# Patient Record
Sex: Male | Born: 1970 | Race: Black or African American | Hispanic: No | Marital: Married | Smoking: Former smoker
Health system: Southern US, Community
[De-identification: ages and names within clinical notes are randomized; demographics above are authoritative.]

## PROBLEM LIST (undated history)

## (undated) DIAGNOSIS — E785 Hyperlipidemia, unspecified: Secondary | ICD-10-CM

## (undated) DIAGNOSIS — E119 Type 2 diabetes mellitus without complications: Secondary | ICD-10-CM

## (undated) DIAGNOSIS — I10 Essential (primary) hypertension: Secondary | ICD-10-CM

## (undated) DIAGNOSIS — K921 Melena: Secondary | ICD-10-CM

## (undated) HISTORY — DX: Essential (primary) hypertension: I10

## (undated) HISTORY — DX: Hyperlipidemia, unspecified: E78.5

## (undated) HISTORY — PX: HERNIA REPAIR: SHX51

## (undated) HISTORY — DX: Melena: K92.1

---

## 1997-10-31 ENCOUNTER — Encounter: Admission: RE | Admit: 1997-10-31 | Discharge: 1997-10-31 | Payer: Self-pay | Admitting: *Deleted

## 1997-10-31 ENCOUNTER — Emergency Department (HOSPITAL_COMMUNITY): Admission: EM | Admit: 1997-10-31 | Discharge: 1997-10-31 | Payer: Self-pay | Admitting: Emergency Medicine

## 1997-11-03 ENCOUNTER — Encounter: Admission: RE | Admit: 1997-11-03 | Discharge: 1998-02-01 | Payer: Self-pay | Admitting: Internal Medicine

## 2002-08-17 ENCOUNTER — Emergency Department (HOSPITAL_COMMUNITY): Admission: EM | Admit: 2002-08-17 | Discharge: 2002-08-18 | Payer: Self-pay

## 2002-08-18 ENCOUNTER — Encounter: Payer: Self-pay | Admitting: Emergency Medicine

## 2004-01-01 ENCOUNTER — Inpatient Hospital Stay (HOSPITAL_COMMUNITY): Admission: EM | Admit: 2004-01-01 | Discharge: 2004-01-04 | Payer: Self-pay | Admitting: Emergency Medicine

## 2006-12-26 ENCOUNTER — Emergency Department (HOSPITAL_COMMUNITY): Admission: EM | Admit: 2006-12-26 | Discharge: 2006-12-26 | Payer: Self-pay | Admitting: Emergency Medicine

## 2007-02-11 ENCOUNTER — Emergency Department (HOSPITAL_COMMUNITY): Admission: EM | Admit: 2007-02-11 | Discharge: 2007-02-11 | Payer: Self-pay | Admitting: Emergency Medicine

## 2010-09-23 ENCOUNTER — Ambulatory Visit (HOSPITAL_COMMUNITY)
Admission: RE | Admit: 2010-09-23 | Discharge: 2010-09-23 | Disposition: A | Discharge status: Home / Self Care | Payer: 59 | Source: Ambulatory Visit | Attending: Gastroenterology | Admitting: Gastroenterology

## 2010-09-23 DIAGNOSIS — K921 Melena: Secondary | ICD-10-CM | POA: Insufficient documentation

## 2010-09-23 DIAGNOSIS — E119 Type 2 diabetes mellitus without complications: Secondary | ICD-10-CM | POA: Insufficient documentation

## 2010-09-23 DIAGNOSIS — I10 Essential (primary) hypertension: Secondary | ICD-10-CM | POA: Insufficient documentation

## 2010-09-23 HISTORY — PX: COLONOSCOPY: SHX174

## 2010-10-10 NOTE — Op Note (Signed)
  Kirk Cox, Kirk Cox               ACCOUNT NO.:  [id number]  MEDICAL RECORD NO.:  [id number]           PATIENT TYPE:  O  LOCATION:  WLEN                         FACILITY:  [facility]  PHYSICIAN:  Danise Edge, M.D.   DATE OF BIRTH:  1971/02/12  DATE OF PROCEDURE: DATE OF DISCHARGE:                              OPERATIVE REPORT   PROCEDURE:  Diagnostic colonoscopy.  REFERRING PHYSICIAN:  Ancil Boozer, MD.  HISTORY:  Mr. Adon Gehlhausen is a 40 year old male born on Apr 25, 1971.  The patient is undergoing a diagnostic colonoscopy to evaluate resolved hematochezia.  ENDOSCOPIST:  Danise Edge, M.D.  PREMEDICATION:  Fentanyl 100 mcg, Versed 10 mg.  PROCEDURE:  After obtaining informed consent, the patient was placed in the left lateral decubitus position.  Anal inspection and digital rectal exam were normal.  The Pentax pediatric colonoscope was introduced into the rectum and easily advanced to the cecum.  A normal-appearing ileocecal valve and appendiceal orifice were identified.  Colonic preparation for the exam today was good.  Rectum normal.  Retroflex view of the distal rectum normal.  Sigmoid colon and descending colon normal.  Splenic flexure normal.  Transverse colon normal.  Hepatic flexure normal.  Ascending colon normal.  Cecum and ileocecal valve normal.  ASSESSMENT:  Normal diagnostic proctocolonoscopy to the cecum.  RECOMMENDATIONS:  Schedule screening colonoscopy in 10 years.          ______________________________ Danise Edge, M.D.     MJ/MEDQ  D:  09/23/2010  T:  09/23/2010  Job:  [id number]  cc:   Ancil Boozer, MD Fax: [phone]  Electronically Signed by Danise Edge M.D. on 10/10/2010 08:52:39 AM

## 2010-10-15 NOTE — Discharge Summary (Signed)
NAMEPIERSON, Kirk Cox                         ACCOUNT NO.:  [id number]   MEDICAL RECORD NO.:  [id number]                   PATIENT TYPE:  INP   LOCATION:  0471                                 FACILITY:  [facility]   PHYSICIAN:  Jonna L. Robb Matar, M.D.            DATE OF BIRTH:  10/29/1970   DATE OF ADMISSION:  01/01/2004  DATE OF DISCHARGE:  01/04/2004                                 DISCHARGE SUMMARY   PRIMARY CARE PHYSICIAN:  Unassigned.   FINAL DIAGNOSES:  1. Polymyositis of uncertain etiology.  2. Abnormal liver function tests.  3. Hypokalemia.   HISTORY:  This 40 year old African-American male had a history characterized  by anorexia, low-grade fever, headache, and he started to develop a  generalized numbness sensation of his body and soreness, and subjective  swelling in his forearms and thighs, and it has become increasingly painful,  and the patient came to the emergency room.  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   PHYSICAL EXAMINATION:  VITAL SIGNS:  Temperature of 101.3.  NECK:  Supple and nontender.  ABDOMEN:  There was no hepatosplenomegaly.  EXTREMITIES:  He had swelling, edema, and tenderness to palpation of the  forearms and thighs.  The patient was quite uncomfortable.   LABORATORY DATA:  Initial laboratory data showed a sodium of 132, potassium  3.4, BUN 12, creatinine 1.4.  AST and ALT 51 and 48.  White count 3.3.   HOSPITAL COURSE:  The patient was hydrated.  The fever cleared over the next  24 hours, although he remained very sore and stiff and slightly weak.  By  January 03, 2004, the weakness had improved.  Workup showed that the SGOT  initially went from 51 down to 40, but had increased back up to 115,  although that was subsequent to taking doxycycline.  CK was 507 and then  609.  Workup included negative hepatitis A, B, and C, RA, ANA, sedimentation  rate was only 5, TSH was normal.  HIV was negative.  BNP  negative.  Dr.  Roxan Hockey was consulted for infectious disease and felt the patient may be  more likely to have an immune reaction, polymyositis, although this pain  could also be triggered by viruses.  The patient also has a history of tick  exposure, so he was started empirically on some doxycycline.   I discussed the patient with Dr. Orlin Cox over the phone.  She stated that  tests such as EMG's and nerve conduction velocity, and muscle biopsy  generally do not turn positive for at least two weeks.   DISPOSITION:  The patient will be discharged on doxycycline 100 mg b.i.d.  for six more days.  He is to be seen by Dr. Orlin Cox in two weeks, and at  that time he can have followup CK, aldolase, liver function tests, and anti-  Jo-1 antibody test is also  pending.                                               Jonna L. Robb Matar, M.D.    Dorna Bloom  D:  01/04/2004  T:  01/04/2004  Job:  [id number]   cc:   Kirk Cox, M.D.  [address]  [address]  [place]  [place] [address]  Fax: [phone]
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
## 2012-08-21 ENCOUNTER — Emergency Department (HOSPITAL_COMMUNITY): Payer: 59

## 2012-08-21 ENCOUNTER — Encounter (HOSPITAL_COMMUNITY): Payer: Self-pay | Admitting: *Deleted

## 2012-08-21 ENCOUNTER — Inpatient Hospital Stay (HOSPITAL_COMMUNITY): Payer: 59 | Admitting: Anesthesiology

## 2012-08-21 ENCOUNTER — Observation Stay (HOSPITAL_COMMUNITY)
Admission: EM | Admit: 2012-08-21 | Discharge: 2012-08-22 | Disposition: A | Discharge status: Home / Self Care | Payer: 59 | Attending: Surgery | Admitting: Surgery

## 2012-08-21 ENCOUNTER — Inpatient Hospital Stay (HOSPITAL_COMMUNITY): Payer: 59

## 2012-08-21 ENCOUNTER — Encounter (HOSPITAL_COMMUNITY): Payer: Self-pay | Admitting: Anesthesiology

## 2012-08-21 ENCOUNTER — Encounter (HOSPITAL_COMMUNITY)
Admission: EM | Disposition: A | Discharge status: Home / Self Care | Payer: Self-pay | Source: Home / Self Care | Attending: Surgery

## 2012-08-21 DIAGNOSIS — R109 Unspecified abdominal pain: Secondary | ICD-10-CM | POA: Insufficient documentation

## 2012-08-21 DIAGNOSIS — K8010 Calculus of gallbladder with chronic cholecystitis without obstruction: Secondary | ICD-10-CM

## 2012-08-21 DIAGNOSIS — K8000 Calculus of gallbladder with acute cholecystitis without obstruction: Secondary | ICD-10-CM | POA: Insufficient documentation

## 2012-08-21 DIAGNOSIS — Z79899 Other long term (current) drug therapy: Secondary | ICD-10-CM | POA: Insufficient documentation

## 2012-08-21 DIAGNOSIS — F17200 Nicotine dependence, unspecified, uncomplicated: Secondary | ICD-10-CM | POA: Insufficient documentation

## 2012-08-21 DIAGNOSIS — E119 Type 2 diabetes mellitus without complications: Secondary | ICD-10-CM | POA: Insufficient documentation

## 2012-08-21 DIAGNOSIS — K429 Umbilical hernia without obstruction or gangrene: Secondary | ICD-10-CM | POA: Insufficient documentation

## 2012-08-21 DIAGNOSIS — K810 Acute cholecystitis: Secondary | ICD-10-CM

## 2012-08-21 DIAGNOSIS — I10 Essential (primary) hypertension: Secondary | ICD-10-CM | POA: Insufficient documentation

## 2012-08-21 HISTORY — PX: UMBILICAL HERNIA REPAIR: SHX196

## 2012-08-21 HISTORY — PX: CHOLECYSTECTOMY: SHX55

## 2012-08-21 HISTORY — DX: Type 2 diabetes mellitus without complications: E11.9

## 2012-08-21 LAB — POCT I-STAT, CHEM 8
Calcium, Ion: 1.15 mmol/L (ref 1.12–1.23)
Creatinine, Ser: 1.1 mg/dL (ref 0.50–1.35)
Glucose, Bld: 143 mg/dL — ABNORMAL HIGH (ref 70–99)
Hemoglobin: 15 g/dL (ref 13.0–17.0)
TCO2: 28 mmol/L (ref 0–100)

## 2012-08-21 LAB — HEMOGLOBIN A1C: Hgb A1c MFr Bld: 5.8 % — ABNORMAL HIGH (ref <5.7)

## 2012-08-21 LAB — COMPREHENSIVE METABOLIC PANEL
ALT: 22 U/L (ref 0–53)
AST: 22 U/L (ref 0–37)
Albumin: 4.2 g/dL (ref 3.5–5.2)
Alkaline Phosphatase: 76 U/L (ref 39–117)
BUN: 17 mg/dL (ref 6–23)
Chloride: 99 mEq/L (ref 96–112)
Potassium: 3.6 mEq/L (ref 3.5–5.1)
Total Bilirubin: 0.4 mg/dL (ref 0.3–1.2)

## 2012-08-21 LAB — GLUCOSE, CAPILLARY
Glucose-Capillary: 135 mg/dL — ABNORMAL HIGH (ref 70–99)
Glucose-Capillary: 100 mg/dL — ABNORMAL HIGH (ref 70–99)
Glucose-Capillary: 95 mg/dL (ref 70–99)

## 2012-08-21 LAB — CBC WITH DIFFERENTIAL/PLATELET
Basophils Absolute: 0 10*3/uL (ref 0.0–0.1)
HCT: 40.7 % (ref 39.0–52.0)
Hemoglobin: 14.1 g/dL (ref 13.0–17.0)
Lymphocytes Relative: 42 % (ref 12–46)
Monocytes Absolute: 0.5 10*3/uL (ref 0.1–1.0)
Neutro Abs: 3.1 10*3/uL (ref 1.7–7.7)
RDW: 12.6 % (ref 11.5–15.5)
WBC: 6.6 10*3/uL (ref 4.0–10.5)

## 2012-08-21 LAB — LIPASE, BLOOD: Lipase: 31 U/L (ref 11–59)

## 2012-08-21 SURGERY — LAPAROSCOPIC CHOLECYSTECTOMY WITH INTRAOPERATIVE CHOLANGIOGRAM
Anesthesia: General | Site: Abdomen | Wound class: Clean Contaminated

## 2012-08-21 MED ORDER — EPHEDRINE SULFATE 50 MG/ML IJ SOLN
INTRAMUSCULAR | Status: DC | PRN
  Administered 2012-08-21: 10 mg via INTRAVENOUS

## 2012-08-21 MED ORDER — HYDROMORPHONE HCL PF 1 MG/ML IJ SOLN
1.0000 mg | Freq: Once | INTRAMUSCULAR | Status: AC
  Administered 2012-08-21: 1 mg via INTRAVENOUS
  Filled 2012-08-21: qty 1

## 2012-08-21 MED ORDER — NEOSTIGMINE METHYLSULFATE 1 MG/ML IJ SOLN
INTRAMUSCULAR | Status: DC | PRN
  Administered 2012-08-21: 4 mg via INTRAVENOUS

## 2012-08-21 MED ORDER — GLYCOPYRROLATE 0.2 MG/ML IJ SOLN
INTRAMUSCULAR | Status: DC | PRN
  Administered 2012-08-21: .6 mg via INTRAVENOUS

## 2012-08-21 MED ORDER — SODIUM CHLORIDE 0.9 % IV SOLN
INTRAVENOUS | Status: DC | PRN
  Administered 2012-08-21: 10:00:00

## 2012-08-21 MED ORDER — ACETAMINOPHEN 10 MG/ML IV SOLN
INTRAVENOUS | Status: AC
  Filled 2012-08-21: qty 100

## 2012-08-21 MED ORDER — INSULIN ASPART 100 UNIT/ML ~~LOC~~ SOLN: 0.0000 [IU] | Freq: Three times a day (TID) | SUBCUTANEOUS | Status: DC

## 2012-08-21 MED ORDER — SODIUM CHLORIDE 0.9 % IV SOLN
INTRAVENOUS | Status: DC
  Administered 2012-08-21: 19:00:00 via INTRAVENOUS

## 2012-08-21 MED ORDER — IBUPROFEN 600 MG PO TABS
600.0000 mg | ORAL_TABLET | Freq: Four times a day (QID) | ORAL | Status: DC | PRN
  Filled 2012-08-21: qty 1

## 2012-08-21 MED ORDER — SUCCINYLCHOLINE CHLORIDE 20 MG/ML IJ SOLN
INTRAMUSCULAR | Status: DC | PRN
  Administered 2012-08-21: 140 mg via INTRAVENOUS

## 2012-08-21 MED ORDER — ONDANSETRON HCL 4 MG/2ML IJ SOLN: 4.0000 mg | Freq: Four times a day (QID) | INTRAMUSCULAR | Status: DC | PRN

## 2012-08-21 MED ORDER — LACTATED RINGERS IV SOLN
INTRAVENOUS | Status: DC
  Administered 2012-08-21: 1000 mL via INTRAVENOUS

## 2012-08-21 MED ORDER — METOCLOPRAMIDE HCL 5 MG/ML IJ SOLN
INTRAMUSCULAR | Status: DC | PRN
  Administered 2012-08-21: 2.5 mg via INTRAVENOUS

## 2012-08-21 MED ORDER — HYDROMORPHONE HCL PF 1 MG/ML IJ SOLN: 1.0000 mg | Freq: Once | INTRAMUSCULAR | Status: AC

## 2012-08-21 MED ORDER — OXYCODONE-ACETAMINOPHEN 5-325 MG PO TABS
1.0000 | ORAL_TABLET | ORAL | Status: DC | PRN
  Administered 2012-08-22 (×2): 2 via ORAL
  Filled 2012-08-21 (×2): qty 2

## 2012-08-21 MED ORDER — FAMOTIDINE 20 MG PO TABS
20.0000 mg | ORAL_TABLET | Freq: Once | ORAL | Status: AC
  Administered 2012-08-21: 20 mg via ORAL
  Filled 2012-08-21: qty 1

## 2012-08-21 MED ORDER — HYDROMORPHONE HCL PF 1 MG/ML IJ SOLN
INTRAMUSCULAR | Status: AC
  Filled 2012-08-21: qty 1

## 2012-08-21 MED ORDER — MORPHINE SULFATE 2 MG/ML IJ SOLN
1.0000 mg | INTRAMUSCULAR | Status: DC | PRN
  Administered 2012-08-21: 4 mg via INTRAVENOUS
  Administered 2012-08-21 (×2): 2 mg via INTRAVENOUS
  Filled 2012-08-21: qty 2
  Filled 2012-08-21 (×2): qty 1

## 2012-08-21 MED ORDER — GLIPIZIDE 5 MG PO TABS
5.0000 mg | ORAL_TABLET | Freq: Two times a day (BID) | ORAL | Status: DC
  Administered 2012-08-21 – 2012-08-22 (×2): 5 mg via ORAL
  Filled 2012-08-21 (×4): qty 1

## 2012-08-21 MED ORDER — FENTANYL CITRATE 0.05 MG/ML IJ SOLN
50.0000 ug | Freq: Once | INTRAMUSCULAR | Status: AC
  Administered 2012-08-21: 50 ug via INTRAVENOUS
  Filled 2012-08-21: qty 2

## 2012-08-21 MED ORDER — SODIUM CHLORIDE 0.9 % IV BOLUS (SEPSIS)
1000.0000 mL | Freq: Once | INTRAVENOUS | Status: AC
  Administered 2012-08-21: 1000 mL via INTRAVENOUS

## 2012-08-21 MED ORDER — BUPIVACAINE-EPINEPHRINE 0.25% -1:200000 IJ SOLN
INTRAMUSCULAR | Status: DC | PRN
  Administered 2012-08-21: 20 mL

## 2012-08-21 MED ORDER — HYDROMORPHONE HCL PF 1 MG/ML IJ SOLN
INTRAMUSCULAR | Status: AC
  Administered 2012-08-21: 1 mg via INTRAVENOUS
  Filled 2012-08-21: qty 1

## 2012-08-21 MED ORDER — CEFAZOLIN SODIUM-DEXTROSE 2-3 GM-% IV SOLR
2.0000 g | Freq: Three times a day (TID) | INTRAVENOUS | Status: DC
  Administered 2012-08-21: 2 g via INTRAVENOUS
  Filled 2012-08-21 (×2): qty 50

## 2012-08-21 MED ORDER — PROPOFOL 10 MG/ML IV EMUL
INTRAVENOUS | Status: DC | PRN
  Administered 2012-08-21: 200 mg via INTRAVENOUS

## 2012-08-21 MED ORDER — LIDOCAINE HCL (CARDIAC) 20 MG/ML IV SOLN
INTRAVENOUS | Status: DC | PRN
  Administered 2012-08-21: 75 mg via INTRAVENOUS

## 2012-08-21 MED ORDER — PHENYLEPHRINE HCL 10 MG/ML IJ SOLN
INTRAMUSCULAR | Status: DC | PRN
  Administered 2012-08-21: 20 ug via INTRAVENOUS

## 2012-08-21 MED ORDER — LACTATED RINGERS IV SOLN
INTRAVENOUS | Status: DC | PRN
  Administered 2012-08-21: 1000 mL

## 2012-08-21 MED ORDER — POTASSIUM CHLORIDE IN NACL 20-0.45 MEQ/L-% IV SOLN
INTRAVENOUS | Status: DC
  Filled 2012-08-21 (×2): qty 1000

## 2012-08-21 MED ORDER — HYDROMORPHONE HCL PF 1 MG/ML IJ SOLN
0.2500 mg | INTRAMUSCULAR | Status: DC | PRN
  Administered 2012-08-21 (×4): 0.5 mg via INTRAVENOUS

## 2012-08-21 MED ORDER — GI COCKTAIL ~~LOC~~
30.0000 mL | Freq: Once | ORAL | Status: AC
  Administered 2012-08-21: 30 mL via ORAL
  Filled 2012-08-21: qty 30

## 2012-08-21 MED ORDER — ONDANSETRON HCL 4 MG PO TABS: 4.0000 mg | ORAL_TABLET | Freq: Four times a day (QID) | ORAL | Status: DC | PRN

## 2012-08-21 MED ORDER — CISATRACURIUM BESYLATE (PF) 10 MG/5ML IV SOLN
INTRAVENOUS | Status: DC | PRN
  Administered 2012-08-21: 4 mg via INTRAVENOUS
  Administered 2012-08-21: 2 mg via INTRAVENOUS
  Administered 2012-08-21: 6 mg via INTRAVENOUS

## 2012-08-21 MED ORDER — CEFAZOLIN SODIUM-DEXTROSE 2-3 GM-% IV SOLR
INTRAVENOUS | Status: AC
  Filled 2012-08-21: qty 50

## 2012-08-21 MED ORDER — CEFAZOLIN SODIUM-DEXTROSE 2-3 GM-% IV SOLR
2.0000 g | Freq: Three times a day (TID) | INTRAVENOUS | Status: AC
  Administered 2012-08-21 – 2012-08-22 (×2): 2 g via INTRAVENOUS
  Filled 2012-08-21 (×2): qty 50

## 2012-08-21 MED ORDER — INSULIN ASPART 100 UNIT/ML ~~LOC~~ SOLN
4.0000 [IU] | Freq: Three times a day (TID) | SUBCUTANEOUS | Status: DC
  Administered 2012-08-21 – 2012-08-22 (×2): 4 [IU] via SUBCUTANEOUS

## 2012-08-21 MED ORDER — FENTANYL CITRATE 0.05 MG/ML IJ SOLN
INTRAMUSCULAR | Status: DC | PRN
  Administered 2012-08-21 (×5): 50 ug via INTRAVENOUS

## 2012-08-21 MED ORDER — METFORMIN HCL 500 MG PO TABS
500.0000 mg | ORAL_TABLET | Freq: Two times a day (BID) | ORAL | Status: DC
  Administered 2012-08-21 – 2012-08-22 (×2): 500 mg via ORAL
  Filled 2012-08-21 (×4): qty 1

## 2012-08-21 MED ORDER — MIDAZOLAM HCL 5 MG/5ML IJ SOLN
INTRAMUSCULAR | Status: DC | PRN
  Administered 2012-08-21: .5 mg via INTRAVENOUS
  Administered 2012-08-21: 1 mg via INTRAVENOUS

## 2012-08-21 MED ORDER — HYDROMORPHONE HCL PF 1 MG/ML IJ SOLN
1.0000 mg | INTRAMUSCULAR | Status: DC | PRN
  Administered 2012-08-21: 1 mg via INTRAVENOUS
  Filled 2012-08-21: qty 1

## 2012-08-21 MED ORDER — LACTATED RINGERS IV SOLN
INTRAVENOUS | Status: DC
  Administered 2012-08-21 (×2): via INTRAVENOUS

## 2012-08-21 MED ORDER — PROMETHAZINE HCL 25 MG/ML IJ SOLN: 6.2500 mg | INTRAMUSCULAR | Status: DC | PRN

## 2012-08-21 MED ORDER — ACETAMINOPHEN 10 MG/ML IV SOLN
INTRAVENOUS | Status: DC | PRN
  Administered 2012-08-21: 1000 mg via INTRAVENOUS

## 2012-08-21 MED ORDER — ONDANSETRON HCL 4 MG/2ML IJ SOLN
INTRAMUSCULAR | Status: DC | PRN
  Administered 2012-08-21 (×2): 2 mg via INTRAVENOUS

## 2012-08-21 MED ORDER — PANTOPRAZOLE SODIUM 40 MG IV SOLR
40.0000 mg | Freq: Once | INTRAVENOUS | Status: AC
  Administered 2012-08-21: 40 mg via INTRAVENOUS
  Filled 2012-08-21: qty 40

## 2012-08-21 SURGICAL SUPPLY — 42 items
APPLIER CLIP ROT 10 11.4 M/L (STAPLE) ×3
BENZOIN TINCTURE PRP APPL 2/3 (GAUZE/BANDAGES/DRESSINGS) ×3 IMPLANT
CANISTER SUCTION 2500CC (MISCELLANEOUS) ×3 IMPLANT
CHLORAPREP W/TINT 26ML (MISCELLANEOUS) ×3 IMPLANT
CLIP APPLIE ROT 10 11.4 M/L (STAPLE) ×2 IMPLANT
CLOTH BEACON ORANGE TIMEOUT ST (SAFETY) ×3 IMPLANT
COVER MAYO STAND STRL (DRAPES) ×3 IMPLANT
DECANTER SPIKE VIAL GLASS SM (MISCELLANEOUS) ×3 IMPLANT
DRAPE C-ARM 42X72 X-RAY (DRAPES) ×3 IMPLANT
DRAPE LAPAROSCOPIC ABDOMINAL (DRAPES) ×3 IMPLANT
DRAPE UTILITY XL STRL (DRAPES) ×3 IMPLANT
DRSG TEGADERM 2-3/8X2-3/4 SM (GAUZE/BANDAGES/DRESSINGS) ×9 IMPLANT
DRSG TEGADERM 4X4.75 (GAUZE/BANDAGES/DRESSINGS) ×3 IMPLANT
ELECT CAUTERY BLADE 6.4 (BLADE) ×3 IMPLANT
ELECT REM PT RETURN 9FT ADLT (ELECTROSURGICAL) ×3
ELECTRODE REM PT RTRN 9FT ADLT (ELECTROSURGICAL) ×2 IMPLANT
FILTER SMOKE EVAC LAPAROSHD (FILTER) ×3 IMPLANT
GLOVE BIO SURGEON STRL SZ7 (GLOVE) ×3 IMPLANT
GLOVE BIOGEL PI IND STRL 7.0 (GLOVE) ×2 IMPLANT
GLOVE BIOGEL PI IND STRL 7.5 (GLOVE) ×2 IMPLANT
GLOVE BIOGEL PI INDICATOR 7.0 (GLOVE) ×1
GLOVE BIOGEL PI INDICATOR 7.5 (GLOVE) ×1
GOWN STRL NON-REIN LRG LVL3 (GOWN DISPOSABLE) ×6 IMPLANT
GOWN STRL REIN XL XLG (GOWN DISPOSABLE) ×3 IMPLANT
KIT BASIN OR (CUSTOM PROCEDURE TRAY) ×3 IMPLANT
NS IRRIG 1000ML POUR BTL (IV SOLUTION) ×3 IMPLANT
PENCIL BUTTON HOLSTER BLD 10FT (ELECTRODE) ×3 IMPLANT
POUCH SPECIMEN RETRIEVAL 10MM (ENDOMECHANICALS) ×3 IMPLANT
RINGERS IRRIG 1000ML POUR BTL (IV SOLUTION) ×3 IMPLANT
SCISSORS LAP 5X35 DISP (ENDOMECHANICALS) ×3 IMPLANT
SET CHOLANGIOGRAPH MIX (MISCELLANEOUS) ×3 IMPLANT
SET IRRIG TUBING LAPAROSCOPIC (IRRIGATION / IRRIGATOR) ×3 IMPLANT
SOLUTION ANTI FOG 6CC (MISCELLANEOUS) ×3 IMPLANT
STRIP CLOSURE SKIN 1/2X4 (GAUZE/BANDAGES/DRESSINGS) ×3 IMPLANT
SUT MNCRL AB 4-0 PS2 18 (SUTURE) ×3 IMPLANT
SUT NOVA NAB DX-16 0-1 5-0 T12 (SUTURE) ×3 IMPLANT
TOWEL OR 17X26 10 PK STRL BLUE (TOWEL DISPOSABLE) ×3 IMPLANT
TRAY LAP CHOLE (CUSTOM PROCEDURE TRAY) ×3 IMPLANT
TROCAR BLADELESS OPT 5 75 (ENDOMECHANICALS) ×6 IMPLANT
TROCAR XCEL BLUNT TIP 100MML (ENDOMECHANICALS) ×3 IMPLANT
TROCAR XCEL NON-BLD 11X100MML (ENDOMECHANICALS) ×3 IMPLANT
TUBING INSUFFLATION 10FT LAP (TUBING) ×3 IMPLANT

## 2012-08-21 NOTE — Op Note (Signed)
Indications: This patient presents with symptomatic gallbladder disease and will undergo laparoscopic cholecystectomy.  Pre-operative Diagnosis: Calculus of gallbladder with acute cholecystitis, without mention of obstruction  Post-operative Diagnosis: Same; umbilical hernia  Procedure:  Laparoscopic cholecystectomy with intraoperative cholangiogram; umbilical hernia repair  Surgeon: Wynona Luna.   Assistants: none  Anesthesia: General endotracheal anesthesia  ASA Class: 2E  Procedure Details  The patient was seen again in the Holding Room. The risks, benefits, complications, treatment options, and expected outcomes were discussed with the patient. The possibilities of reaction to medication, pulmonary aspiration, perforation of viscus, bleeding, recurrent infection, finding a normal gallbladder, the need for additional procedures, failure to diagnose a condition, the possible need to convert to an open procedure, and creating a complication requiring transfusion or operation were discussed with the patient. The likelihood of improving the patient's symptoms with return to their baseline status is good.  The patient and/or family concurred with the proposed plan, giving informed consent. The site of surgery properly noted. The patient was taken to Operating Room, identified as Arna Snipe and the procedure verified as Laparoscopic Cholecystectomy with Intraoperative Cholangiogram. A Time Out was held and the above information confirmed.  Prior to the induction of general anesthesia, antibiotic prophylaxis was administered. General endotracheal anesthesia was then administered and tolerated well. After the induction, the abdomen was prepped with Chloraprep and draped in sterile fashion. He has an obvious umbilical hernia.  The patient was positioned in the supine position.  Local anesthetic agent was injected into the skin near the umbilicus and an incision made. We dissected down to  the hernia sac with blunt dissection.  Cautery was used to free up the fascia. We entered the peritoneal cavity bluntly.  A pursestring suture of 0-Vicryl was placed around the fascial opening.  The Hasson cannula was inserted and secured with the stay suture.  Pneumoperitoneum was then created with CO2 and tolerated well without any adverse changes in the patient's vital signs. An 11-mm port was placed in the subxiphoid position.  Two 5-mm ports were placed in the right upper quadrant. All skin incisions were infiltrated with a local anesthetic agent before making the incision and placing the trocars.   We positioned the patient in reverse Trendelenburg, tilted slightly to the patient's left.  The gallbladder was identified and was very edematous and swollen.  We aspirated the gallbladder, then the fundus was grasped and retracted cephalad. Adhesions were lysed bluntly and with the electrocautery where indicated, taking care not to injure any adjacent organs or viscus. The infundibulum was grasped and retracted laterally, exposing the peritoneum overlying the triangle of Calot. This was then divided and exposed in a blunt fashion. A critical view of the cystic duct and cystic artery was obtained.  The cystic duct was clearly identified and bluntly dissected circumferentially. The cystic duct was ligated with a clip distally.   An incision was made in the cystic duct and the [facility] cholangiogram catheter introduced. The catheter was secured using a clip. A cholangiogram was then obtained which showed good visualization of the distal and proximal biliary tree with no sign of filling defects or obstruction.  Contrast flowed easily into the duodenum. The catheter was then removed.   The cystic duct was then ligated with clips and divided. The cystic artery was identified, dissected free, ligated with clips and divided as well.   The gallbladder was dissected from the liver bed in retrograde fashion with the  electrocautery. The gallbladder was removed and  placed in an Endocatch sac. The liver bed was irrigated and inspected. Hemostasis was achieved with the electrocautery. Copious irrigation was utilized and was repeatedly aspirated until clear.  The gallbladder and Endocatch sac were then removed through the umbilical port site.    We then closed the umbilical hernia with multiple figure-of-eight 1 Novofil sutures.  The posterior surface of our closure was inspected with the laparoscope.  We again inspected the right upper quadrant for hemostasis.  Pneumoperitoneum was released as we removed the trocars.  The umbilical subcutaneous tissues were closed with 0 Vicryl.  4-0 Monocryl was used to close the skin.   Benzoin, steri-strips, and clean dressings were applied. The patient was then extubated and brought to the recovery room in stable condition. Instrument, sponge, and needle counts were correct at closure and at the conclusion of the case.   Findings: Cholecystitis with Cholelithiasis  Estimated Blood Loss: Minimal         Drains: none         Specimens: Gallbladder           Complications: None; patient tolerated the procedure well.         Disposition: PACU - hemodynamically stable.         Condition: stable  Wilmon Arms. Corliss Skains, MD, [facility]  08/21/2012 11:13 AM

## 2012-08-21 NOTE — Preoperative (Signed)
Beta Blockers   Reason not to administer Beta Blockers:Not Applicable 

## 2012-08-21 NOTE — Anesthesia Preprocedure Evaluation (Signed)
Anesthesia Evaluation  Patient identified by MRN, date of birth, ID band Patient awake    Reviewed: Allergy & Precautions, H&P , NPO status , Patient's Chart, lab work & pertinent test results  Airway Mallampati: III TM Distance: >3 FB Neck ROM: Full    Dental  (+) Teeth Intact and Dental Advisory Given   Pulmonary neg pulmonary ROS, Current Smoker,  breath sounds clear to auscultation  Pulmonary exam normal       Cardiovascular negative cardio ROS  Rhythm:Regular Rate:Normal     Neuro/Psych negative neurological ROS  negative psych ROS   GI/Hepatic negative GI ROS, Neg liver ROS,   Endo/Other  diabetes, Type 2, Oral Hypoglycemic Agents  Renal/GU negative Renal ROS  negative genitourinary   Musculoskeletal negative musculoskeletal ROS (+)   Abdominal   Peds negative pediatric ROS (+)  Hematology negative hematology ROS (+)   Anesthesia Other Findings   Reproductive/Obstetrics negative OB ROS                           Anesthesia Physical Anesthesia Plan  ASA: II and emergent  Anesthesia Plan: General   Post-op Pain Management:    Induction: Intravenous  Airway Management Planned: Oral ETT  Additional Equipment:   Intra-op Plan:   Post-operative Plan: Extubation in OR  Informed Consent: I have reviewed the patients History and Physical, chart, labs and discussed the procedure including the risks, benefits and alternatives for the proposed anesthesia with the patient or authorized representative who has indicated his/her understanding and acceptance.   Dental advisory given  Plan Discussed with: CRNA  Anesthesia Plan Comments:         Anesthesia Quick Evaluation
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
## 2012-08-21 NOTE — Transfer of Care (Signed)
Immediate Anesthesia Transfer of Care Note  Patient: Kirk Cox  Procedure(s) Performed: Procedure(s): LAPAROSCOPIC CHOLECYSTECTOMY WITH INTRAOPERATIVE CHOLANGIOGRAM (N/A) HERNIA REPAIR UMBILICAL ADULT  Patient Location: PACU  Anesthesia Type:General  Level of Consciousness: awake, alert , oriented and patient cooperative  Airway & Oxygen Therapy: Patient Spontanous Breathing and Patient connected to face mask oxygen  Post-op Assessment: Report given to PACU RN, Post -op Vital signs reviewed and stable and Patient moving all extremities  Post vital signs: Reviewed and stable  Complications: No apparent anesthesia complications

## 2012-08-21 NOTE — ED Notes (Signed)
Surgeon at bedside.  

## 2012-08-21 NOTE — ED Notes (Signed)
Pt c/o upper abd pain since Sunday am; saw pcp Monday and told to come to ER if no better; denies nausea/vomiting; denies etoh

## 2012-08-21 NOTE — H&P (Signed)
Re:   Kirk Cox DOB:   04/14/1971 MRN:   [id number]   ASSESSMENT AND PLAN: 1.  Cholecystitis with cholelithiasis  I discussed with the patient the indications and risks of gall bladder surgery.  The primary risks of gall bladder surgery include, but are not limited to, bleeding, infection, common bile duct injury, and open surgery.  There is also the risk that the patient may have continued symptoms after surgery.  However, the likelihood of improvement in symptoms and return to the patient's normal status is good. We discussed the typical post-operative recovery course. I tried to answer the patient's questions.  I am on call and Dr. Corliss Skains is the surgeon of the week.  I will discuss the case with him.   2.  Diabetes mellitus x 2 years 3.  History of hypertension, but is not on meds 4.  Smokes about 1/3 ppd  Chief Complaint  Patient presents with  . Abdominal Pain   REFERRING PHYSICIAN: Dr. Dierdre Highman, [facility]  HISTORY OF PRESENT ILLNESS: Kirk Cox is a 42 y.o. (DOB: 05-May-1971)  AA  male whose primary care physician is [facility] (but he cannot remember the name of his PCP) and comes to [facility] is increasing abdominal pain.  He has had some vague abdominal pain that has never be bad over the past several months.  He's just thought of it as indigestion.  He has no history of liver, pancreas, or colon disease.  He has had some blood in his stool.  Dr. Josefa Half did a colonoscopy about 2 years ago which was negative.  He did have an inguinal hernia repair many years ago.  He has no family history of gall bladder disease.  But his wife presented to the [facility] about 2 years ago with gall bladder disease and Dr. Johna Sheriff did her gall bladder surgery.  [place] - 08/21/2012 - shows multiple gall stones WBC - 6,600 T. Bili. - 0.4    Past Medical History  Diagnosis Date  . Diabetes mellitus without complication      History reviewed. No pertinent past surgical history.    No current  facility-administered medications for this encounter.   Current Outpatient Prescriptions  Medication Sig Dispense Refill  . glipiZIDE (GLUCOTROL) 5 MG tablet Take 5 mg by mouth 2 (two) times daily before a meal.      . metFORMIN (GLUCOPHAGE) 500 MG tablet Take 500 mg by mouth 2 (two) times daily with a meal.         No Known Allergies  REVIEW OF SYSTEMS: Skin:  No history of rash.  No history of abnormal moles. Infection:  No history of hepatitis or HIV.  No history of MRSA. Neurologic:  No history of stroke.  No history of seizure.  No history of headaches. Cardiac:  Has taken blood pressure meds, but is not taking them now. Pulmonary:  Smokes 1/3 ppd.  Endocrine:  Diabetes x 2 years.  He's unaware of his HgbA1C. No thyroid disease. Gastrointestinal:  See HPI. Urologic:  No history of kidney stones.  No history of bladder infections. Musculoskeletal:  No history of joint or back disease. Hematologic:  No bleeding disorder.  No history of anemia.  Not anticoagulated. Psycho-social:  The patient is oriented.   The patient has no obvious psychologic or social impairment to understanding our conversation and plan.  SOCIAL and FAMILY HISTORY: Married. Wife and brother in exam room. 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  PHYSICAL EXAM: BP 123/60  Pulse 65  Temp(Src) 97.5 F (36.4 C) (Oral)  Resp 20  SpO2 100%  General: Moderately obese AA M who is alert and generally healthy appearing.  HEENT: Normal. Pupils equal. Neck: Supple. No mass.  No thyroid mass.  Thick neck. Lymph Nodes:  No supraclavicular or cervical nodes. Lungs: Clear to auscultation and symmetric breath sounds. Heart:  RRR. No murmur or rub. Abdomen: Soft. No mass. No hernia. Normal bowel sounds.  Tender RUQ with guarding. Rectal: Not done. Extremities:  Good strength and ROM  in upper and lower extremities. Neurologic:  Grossly intact to motor and sensory function. Psychiatric: Has normal mood  and affect. Behavior is normal.   DATA REVIEWED: Labs and [place] reviewed.  Ovidio Kin, MD,  [facility] [address] [place]  [address]   [place], [place]    [address] Phone:  [phone] FAX:  [phone]

## 2012-08-21 NOTE — ED Notes (Signed)
Patient transported to Ultrasound 

## 2012-08-21 NOTE — Anesthesia Postprocedure Evaluation (Signed)
Anesthesia Post Note  Patient: Kirk Cox  Procedure(s) Performed: Procedure(s) (LRB): LAPAROSCOPIC CHOLECYSTECTOMY WITH INTRAOPERATIVE CHOLANGIOGRAM (N/A) HERNIA REPAIR UMBILICAL ADULT  Anesthesia type: General  Patient location: PACU  Post pain: Pain level controlled  Post assessment: Post-op Vital signs reviewed  Last Vitals:  Filed Vitals:   08/21/12 1130  BP: 142/73  Pulse: 95  Temp:   Resp: 17    Post vital signs: Reviewed  Level of consciousness: sedated  Complications: No apparent anesthesia complications

## 2012-08-22 ENCOUNTER — Encounter (HOSPITAL_COMMUNITY): Payer: Self-pay | Admitting: Surgery

## 2012-08-22 MED ORDER — OXYCODONE-ACETAMINOPHEN 5-325 MG PO TABS: 1.0000 | ORAL_TABLET | ORAL | Status: DC | PRN

## 2012-08-22 MED ORDER — IBUPROFEN 600 MG PO TABS: 600.0000 mg | ORAL_TABLET | Freq: Four times a day (QID) | ORAL | Status: DC | PRN

## 2012-08-22 NOTE — Discharge Summary (Signed)
  Physician Discharge Summary  Patient ID: Kirk Cox MRN: [id number] DOB/AGE: 1971/03/30 42 y.o.  Admit date: 08/21/2012 Discharge date: 08/22/2012  Admitting Diagnosis: Calculus of gallbladder with acute cholecystitis Umbilical hernia  Discharge Diagnosis Calculus of gallbladder with acute cholecystitis Umbilical hernia Consultants None  Procedures Laparoscopic Cholecystectomy with IOC Umbilical hernia repair  Hospital Course 42 yr old male who presented to [facility] with abdominal pain.  Workup showed cholecystitis and cholelithiasis.  Patient was admitted and underwent procedure listed above.  Tolerated procedure well and was transferred to the floor.  Diet was advanced as tolerated.  On POD#1, the patient was voiding well, tolerating diet, ambulating well, pain well controlled, vital signs stable, incisions c/d/i and felt stable for discharge home.  Patient will follow up in our office in 2 weeks and knows to call with questions or concerns.    Medication List    TAKE these medications       glipiZIDE 5 MG tablet  Commonly known as:  GLUCOTROL  Take 5 mg by mouth 2 (two) times daily before a meal.     ibuprofen 600 MG tablet  Commonly known as:  ADVIL,MOTRIN  Take 1 tablet (600 mg total) by mouth every 6 (six) hours as needed (mild pain).     metFORMIN 500 MG tablet  Commonly known as:  GLUCOPHAGE  Take 500 mg by mouth 2 (two) times daily with a meal.     oxyCODONE-acetaminophen 5-325 MG per tablet  Commonly known as:  PERCOCET/ROXICET  Take 1-2 tablets by mouth every 4 (four) hours as needed.             Follow-up Information   Follow up with [facility]. (2-3 weeks for post-op follow up.  Our office will contact you with your appt day and time)    Contact information:   [address]   [place] [address] [phone]       Signed: Denny Levy [facility] [phone]  08/22/2012, 8:38 AM

## 2012-08-22 NOTE — Progress Notes (Signed)
Feeling better. Ready for discharge.  Kirk Cox. Corliss Skains, MD, [facility]  08/22/2012 2:10 PM

## 2012-08-22 NOTE — Discharge Instructions (Signed)
[facility] ______[facility] °LAPAROSCOPIC SURGERY: POST OP INSTRUCTIONS °Always review your discharge instruction sheet given to you by the facility where your surgery was performed. °IF YOU HAVE DISABILITY OR FAMILY LEAVE FORMS, YOU MUST BRING THEM TO THE OFFICE FOR PROCESSING.   °DO NOT GIVE THEM TO YOUR DOCTOR. ° °1. A prescription for pain medication may be given to you upon discharge.  Take your pain medication as prescribed, if needed.  If narcotic pain medicine is not needed, then you may take acetaminophen (Tylenol) or ibuprofen (Advil) as needed. °2. Take your usually prescribed medications unless otherwise directed. °3. If you need a refill on your pain medication, please contact your pharmacy.  They will contact our office to request authorization. Prescriptions will not be filled after 5pm or on week-ends. °4. You should follow a light diet the first few days after arrival home, such as soup and crackers, etc.  Be sure to include lots of fluids daily. °5. Most patients will experience some swelling and bruising in the area of the incisions.  Ice packs will help.  Swelling and bruising can take several days to resolve.  °6. It is common to experience some constipation if taking pain medication after surgery.  Increasing fluid intake and taking a stool softener (such as Colace) will usually help or prevent this problem from occurring.  A mild laxative (Milk of Magnesia or Miralax) should be taken according to package instructions if there are no bowel movements after 48 hours. °7. Unless discharge instructions indicate otherwise, you may remove your bandages 24-48 hours after surgery, and you may shower at that time.  You may have steri-strips (small skin tapes) in place directly over the incision.  These strips should be left on the skin for 7-10 days.  If your surgeon used skin glue on the incision, you may shower in 24 hours.  The glue will flake off over the next 2-3 weeks.  Any sutures or  staples will be removed at the office during your follow-up visit. °8. ACTIVITIES:  You may resume regular (light) daily activities beginning the next day--such as daily self-care, walking, climbing stairs--gradually increasing activities as tolerated.  You may have sexual intercourse when it is comfortable.  Refrain from any heavy lifting or straining until approved by your doctor. °a. You may drive when you are no longer taking prescription pain medication, you can comfortably wear a seatbelt, and you can safely maneuver your car and apply brakes. °b. RETURN TO WORK:  __________________________________________________________ °9. You should see your doctor in the office for a follow-up appointment approximately 2-3 weeks after your surgery.  Make sure that you call for this appointment within a day or two after you arrive home to insure a convenient appointment time. °10. OTHER INSTRUCTIONS: __________________________________________________________________________________________________________________________ __________________________________________________________________________________________________________________________ °WHEN TO CALL YOUR DOCTOR: °1. Fever over 101.0 °2. Inability to urinate °3. Continued bleeding from incision. °4. Increased pain, redness, or drainage from the incision. °5. Increasing abdominal pain ° °The clinic staff is available to answer your questions during regular business hours.  Please don’t hesitate to call and ask to speak to one of the nurses for clinical concerns.  If you have a medical emergency, go to the nearest emergency room or call 911.  A surgeon from [facility] is always on call at the hospital. °[address], [address], [place], [place]  [address] ? [address] [address], [place], [place]   [address] °[phone] ? [phone] ? FAX [phone] °Web site:   [email or web address] °
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
## 2012-08-22 NOTE — Progress Notes (Signed)
Pt for d/c home today. IV D/C'd. Dressing CDI to abdomen. Pt claims getting relief of pain from Percocet. Pt & wife decided to go home this pm. Claims feeling more improved than this am (pt was puny). Voiding with no difficulty. Tolerates ambulation on hallway. D/C instructions & RX given with verbalized understanding. Father at bedside to bring pt home & assist with d/c.

## 2012-08-24 ENCOUNTER — Telehealth (INDEPENDENT_AMBULATORY_CARE_PROVIDER_SITE_OTHER): Payer: Self-pay | Admitting: General Surgery

## 2012-08-24 NOTE — Telephone Encounter (Signed)
Pt called to ask about the steri-strips and when to expect them to come off.  He is S/P lap chole.  Reassured pt that as time elapses and he takes showers, the edges will begin to curl up and loosen.  Went over the showering instructions and he understands all.

## 2012-09-11 ENCOUNTER — Encounter (INDEPENDENT_AMBULATORY_CARE_PROVIDER_SITE_OTHER): Payer: Self-pay | Admitting: General Surgery

## 2012-09-11 ENCOUNTER — Ambulatory Visit (INDEPENDENT_AMBULATORY_CARE_PROVIDER_SITE_OTHER): Payer: 59 | Admitting: General Surgery

## 2012-09-11 ENCOUNTER — Encounter: Payer: Self-pay | Admitting: General Surgery

## 2012-09-11 VITALS — BP 138/80 | HR 82 | Resp 18 | Ht 66.0 in | Wt 222.0 lb

## 2012-09-11 DIAGNOSIS — K8000 Calculus of gallbladder with acute cholecystitis without obstruction: Secondary | ICD-10-CM

## 2012-09-11 NOTE — Patient Instructions (Signed)
Call if you have any more issues.

## 2012-09-11 NOTE — Progress Notes (Signed)
Kirk Cox May 26, 1971 [id number] 09/11/2012   Kirk Cox is a 42 y.o. male who had a laparoscopic cholecystectomy with intraoperative cholangiogram, and umbilical hernia repair by Dr. Corliss Skains.  The pathology report confirmed Chronic cholecystitis, chholelithiasis.  The patient reports that they are feeling well with normal bowel movements and good appetite.  The pre-operative symptoms of abdominal pain, nausea, and vomiting have resolved.    Physical examination - Incisions appear well-healed with no sign of infection or bleeding.   BP 138/80  Pulse 82  Resp 18  Ht 5\' 6"  (1.676 m)  Wt 100.699 kg (222 lb)  BMI 35.85 kg/m2  Abdomen - soft, non-tender Incisions healing nicely Impression:  s/p laparoscopic cholecystectomy  Plan:  He may resume a regular diet and full activity.  He may follow-up on a PRN basis.

## 2014-09-26 ENCOUNTER — Other Ambulatory Visit: Payer: Self-pay | Admitting: Family Medicine

## 2014-09-26 ENCOUNTER — Ambulatory Visit
Admission: RE | Admit: 2014-09-26 | Discharge: 2014-09-26 | Disposition: A | Discharge status: Home / Self Care | Payer: Self-pay | Source: Ambulatory Visit | Attending: Family Medicine | Admitting: Family Medicine

## 2014-09-26 DIAGNOSIS — M79672 Pain in left foot: Secondary | ICD-10-CM

## 2014-12-26 ENCOUNTER — Emergency Department (HOSPITAL_COMMUNITY): Payer: Commercial Managed Care - HMO

## 2014-12-26 ENCOUNTER — Emergency Department (HOSPITAL_COMMUNITY)
Admission: EM | Admit: 2014-12-26 | Discharge: 2014-12-26 | Disposition: A | Discharge status: Home / Self Care | Payer: Commercial Managed Care - HMO | Attending: Emergency Medicine | Admitting: Emergency Medicine

## 2014-12-26 ENCOUNTER — Encounter (HOSPITAL_COMMUNITY): Payer: Self-pay | Admitting: *Deleted

## 2014-12-26 DIAGNOSIS — R519 Headache, unspecified: Secondary | ICD-10-CM

## 2014-12-26 DIAGNOSIS — R51 Headache: Secondary | ICD-10-CM | POA: Diagnosis not present

## 2014-12-26 DIAGNOSIS — Z79899 Other long term (current) drug therapy: Secondary | ICD-10-CM | POA: Diagnosis not present

## 2014-12-26 DIAGNOSIS — I10 Essential (primary) hypertension: Secondary | ICD-10-CM | POA: Insufficient documentation

## 2014-12-26 DIAGNOSIS — Z720 Tobacco use: Secondary | ICD-10-CM | POA: Insufficient documentation

## 2014-12-26 DIAGNOSIS — E119 Type 2 diabetes mellitus without complications: Secondary | ICD-10-CM | POA: Diagnosis not present

## 2014-12-26 DIAGNOSIS — G43809 Other migraine, not intractable, without status migrainosus: Secondary | ICD-10-CM | POA: Insufficient documentation

## 2014-12-26 LAB — URINALYSIS, ROUTINE W REFLEX MICROSCOPIC
BILIRUBIN URINE: NEGATIVE
GLUCOSE, UA: NEGATIVE mg/dL
HGB URINE DIPSTICK: NEGATIVE
KETONES UR: NEGATIVE mg/dL
Leukocytes, UA: NEGATIVE
NITRITE: NEGATIVE
PH: 7.5 (ref 5.0–8.0)
Protein, ur: NEGATIVE mg/dL
SPECIFIC GRAVITY, URINE: 1.016 (ref 1.005–1.030)
Urobilinogen, UA: 1 mg/dL (ref 0.0–1.0)

## 2014-12-26 LAB — COMPREHENSIVE METABOLIC PANEL
ALT: 29 U/L (ref 17–63)
AST: 31 U/L (ref 15–41)
Albumin: 4.2 g/dL (ref 3.5–5.0)
Alkaline Phosphatase: 76 U/L (ref 38–126)
Anion gap: 11 (ref 5–15)
BILIRUBIN TOTAL: 0.9 mg/dL (ref 0.3–1.2)
BUN: 11 mg/dL (ref 6–20)
CHLORIDE: 106 mmol/L (ref 101–111)
CO2: 20 mmol/L — ABNORMAL LOW (ref 22–32)
Calcium: 10 mg/dL (ref 8.9–10.3)
Creatinine, Ser: 1.02 mg/dL (ref 0.61–1.24)
Glucose, Bld: 164 mg/dL — ABNORMAL HIGH (ref 65–99)
Potassium: 3.7 mmol/L (ref 3.5–5.1)
SODIUM: 137 mmol/L (ref 135–145)
TOTAL PROTEIN: 7.1 g/dL (ref 6.5–8.1)

## 2014-12-26 LAB — DIFFERENTIAL
Basophils Absolute: 0 10*3/uL (ref 0.0–0.1)
Basophils Relative: 0 % (ref 0–1)
EOS ABS: 0.1 10*3/uL (ref 0.0–0.7)
Eosinophils Relative: 3 % (ref 0–5)
LYMPHS ABS: 2.2 10*3/uL (ref 0.7–4.0)
Lymphocytes Relative: 45 % (ref 12–46)
MONO ABS: 0.4 10*3/uL (ref 0.1–1.0)
MONOS PCT: 8 % (ref 3–12)
NEUTROS ABS: 2.2 10*3/uL (ref 1.7–7.7)
NEUTROS PCT: 44 % (ref 43–77)

## 2014-12-26 LAB — CBC
HCT: 43.2 % (ref 39.0–52.0)
HEMOGLOBIN: 14.9 g/dL (ref 13.0–17.0)
MCH: 27 pg (ref 26.0–34.0)
MCHC: 34.5 g/dL (ref 30.0–36.0)
MCV: 78.3 fL (ref 78.0–100.0)
Platelets: 227 10*3/uL (ref 150–400)
RBC: 5.52 MIL/uL (ref 4.22–5.81)
RDW: 12.9 % (ref 11.5–15.5)
WBC: 5 10*3/uL (ref 4.0–10.5)

## 2014-12-26 LAB — CBG MONITORING, ED: Glucose-Capillary: 160 mg/dL — ABNORMAL HIGH (ref 65–99)

## 2014-12-26 LAB — RAPID URINE DRUG SCREEN, HOSP PERFORMED
Amphetamines: NOT DETECTED
BARBITURATES: NOT DETECTED
Benzodiazepines: NOT DETECTED
COCAINE: NOT DETECTED
OPIATES: NOT DETECTED
TETRAHYDROCANNABINOL: NOT DETECTED

## 2014-12-26 LAB — I-STAT CHEM 8, ED
BUN: 11 mg/dL (ref 6–20)
CHLORIDE: 104 mmol/L (ref 101–111)
Calcium, Ion: 1.14 mmol/L (ref 1.12–1.23)
Creatinine, Ser: 0.9 mg/dL (ref 0.61–1.24)
Glucose, Bld: 166 mg/dL — ABNORMAL HIGH (ref 65–99)
HEMATOCRIT: 46 % (ref 39.0–52.0)
Hemoglobin: 15.6 g/dL (ref 13.0–17.0)
Potassium: 3.6 mmol/L (ref 3.5–5.1)
SODIUM: 139 mmol/L (ref 135–145)
TCO2: 18 mmol/L (ref 0–100)

## 2014-12-26 LAB — PROTIME-INR
INR: 1.01 (ref 0.00–1.49)
Prothrombin Time: 13.5 seconds (ref 11.6–15.2)

## 2014-12-26 LAB — ETHANOL

## 2014-12-26 LAB — I-STAT TROPONIN, ED: Troponin i, poc: 0 ng/mL (ref 0.00–0.08)

## 2014-12-26 LAB — APTT: aPTT: 29 seconds (ref 24–37)

## 2014-12-26 MED ORDER — PROCHLORPERAZINE EDISYLATE 5 MG/ML IJ SOLN
10.0000 mg | Freq: Four times a day (QID) | INTRAMUSCULAR | Status: DC | PRN
  Administered 2014-12-26: 10 mg via INTRAVENOUS
  Filled 2014-12-26: qty 2

## 2014-12-26 MED ORDER — KETOROLAC TROMETHAMINE 30 MG/ML IJ SOLN
15.0000 mg | Freq: Once | INTRAMUSCULAR | Status: AC
  Administered 2014-12-26: 15 mg via INTRAVENOUS
  Filled 2014-12-26: qty 1

## 2014-12-26 MED ORDER — FENTANYL CITRATE (PF) 100 MCG/2ML IJ SOLN
50.0000 ug | Freq: Once | INTRAMUSCULAR | Status: AC
  Administered 2014-12-26: 50 ug via INTRAVENOUS
  Filled 2014-12-26: qty 2

## 2014-12-26 MED ORDER — PROMETHAZINE HCL 25 MG/ML IJ SOLN
12.5000 mg | Freq: Four times a day (QID) | INTRAMUSCULAR | Status: DC | PRN
  Administered 2014-12-26: 12.5 mg via INTRAVENOUS
  Filled 2014-12-26 (×2): qty 1

## 2014-12-26 NOTE — ED Provider Notes (Signed)
CSN: [id number]     Arrival date & time 12/26/14  6213 History   First MD Initiated Contact with Patient 12/26/14 0900     Chief Complaint  Patient presents with  . Code Stroke    @  HPI Pt in from work via [facility] per report pt was at work & had sudden onset HA & blurred vision :33 self reported, pt hx of HTN with not taking Lisinopril last night, pt had DM with CBG 156, takes metformin, -slurred speech, face symmetrical, equal grips & extremity strength bilaterally, Pt A&Ox4, NSR Past Medical History  Diagnosis Date  . Diabetes mellitus without complication    Past Surgical History  Procedure Laterality Date  . Cholecystectomy N/A 08/21/2012    Procedure: LAPAROSCOPIC CHOLECYSTECTOMY WITH INTRAOPERATIVE CHOLANGIOGRAM;  Surgeon: Wilmon Arms. Corliss Skains, MD;  Location: [facility];  Service: General;  Laterality: N/A;  . Umbilical hernia repair  08/21/2012    Procedure: HERNIA REPAIR UMBILICAL ADULT;  Surgeon: Wilmon Arms. Corliss Skains, MD;  Location: [facility];  Service: General;;  . Hernia repair     Family History  Problem Relation Age of Onset  . Diabetes Mother   . Diabetes Father    History  Substance Use Topics  . Smoking status: Current Every Day Smoker -- 0.50 packs/day    Types: Cigarettes  . Smokeless tobacco: Not on file  . Alcohol Use: Yes     Comment: Social.    Review of Systems  Neurological: Positive for headaches. Negative for speech difficulty.  All other systems reviewed and are negative.     Allergies  Review of patient's allergies indicates no known allergies.  Home Medications   Prior to Admission medications   Medication Sig Start Date End Date Taking? Authorizing Provider  lisinopril (PRINIVIL,ZESTRIL) 5 MG tablet Take 5 mg by mouth daily.   Yes Historical Provider, MD  metFORMIN (GLUCOPHAGE) 500 MG tablet Take 500 mg by mouth 2 (two) times daily with a meal.   Yes Historical Provider, MD  ibuprofen (ADVIL,MOTRIN) 600 MG tablet Take 1 tablet (600 mg  total) by mouth every 6 (six) hours as needed (mild pain). Patient not taking: Reported on 12/26/2014 08/22/12   Austin Miles White, PA-C  oxyCODONE-acetaminophen (PERCOCET/ROXICET) 5-325 MG per tablet Take 1-2 tablets by mouth every 4 (four) hours as needed. Patient not taking: Reported on 12/26/2014 08/22/12   Austin Miles White, PA-C  oxyCODONE-acetaminophen (PERCOCET/ROXICET) 5-325 MG per tablet Take 1 tablet by mouth every 4 (four) hours as needed for pain. Patient not taking: Reported on 12/26/2014 08/22/12   Manus Rudd, MD   BP 122/64 mmHg  Pulse 58  Temp(Src) 97.8 F (36.6 C) (Oral)  Resp 16  SpO2 100% Physical Exam Physical Exam  Nursing note and vitals reviewed. Constitutional: He is oriented to person, place, and time. He appears well-developed and well-nourished. No distress.  HENT:  Head: Normocephalic and atraumatic.  Eyes: Pupils are equal, round, and reactive to light.  Neck: Normal range of motion.  Supple no meningeal signs  Cardiovascular: Normal rate and intact distal pulses.   Pulmonary/Chest: No respiratory distress.  Abdominal: Normal appearance. He exhibits no distension.  Musculoskeletal: Normal range of motion.  Neurological: He is alert and oriented to person, place, and time. No cranial nerve deficit.  No lateralizing weakness or evidence of neurological deficit.   Skin: Skin is warm and dry. No rash noted.  Psychiatric: He has a normal mood and affect. His behavior is normal.   ED Course  Procedures (including critical care time) Medications  fentaNYL (SUBLIMAZE) injection 50 mcg (50 mcg Intravenous Given 12/26/14 1012)  ketorolac (TORADOL) 30 MG/ML injection 15 mg (15 mg Intravenous Given 12/26/14 1446)    Code stroke was called and neurology responded.  Neurology has evaluated. CRITICAL CARE Performed by: Nelia Shi Total critical care time: 30 min Critical care time was exclusive of separately billable procedures and treating other  patients. Critical care was necessary to treat or prevent imminent or life-threatening deterioration. Critical care was time spent personally by me on the following activities: development of treatment plan with patient and/or surrogate as well as nursing, discussions with consultants, evaluation of patient's response to treatment, examination of patient, obtaining history from patient or surrogate, ordering and performing treatments and interventions, ordering and review of laboratory studies, ordering and review of radiographic studies, pulse oximetry and re-evaluation of patient's condition.  Labs Review Labs Reviewed  COMPREHENSIVE METABOLIC PANEL - Abnormal; Notable for the following:    CO2 20 (*)    Glucose, Bld 164 (*)    All other components within normal limits  I-STAT CHEM 8, ED - Abnormal; Notable for the following:    Glucose, Bld 166 (*)    All other components within normal limits  CBG MONITORING, ED - Abnormal; Notable for the following:    Glucose-Capillary 160 (*)    All other components within normal limits  ETHANOL  PROTIME-INR  APTT  CBC  DIFFERENTIAL  URINE RAPID DRUG SCREEN, HOSP PERFORMED  URINALYSIS, ROUTINE W REFLEX MICROSCOPIC (NOT AT [facility])  I-STAT TROPOININ, ED    Imaging Review Ct Head Wo Contrast  12/26/2014   CLINICAL DATA:  Code stroke, severe headache.  Blurred vision.  EXAM: CT HEAD WITHOUT CONTRAST  TECHNIQUE: Contiguous axial images were obtained from the base of the skull through the vertex without intravenous contrast.  COMPARISON:  None.  FINDINGS: There is a small metallic foreign body in the left parietal and occipital scalp with beam hardening artifact which partially obscures the left parietal lobe.  There is no evidence of mass effect, midline shift or extra-axial fluid collections. There is no evidence of a space-occupying lesion or intracranial hemorrhage. There is no evidence of a cortical-based area of acute infarction.  The ventricles and  sulci are appropriate for the patient's age. The basal cisterns are patent.  Visualized portions of the orbits are unremarkable. The visualized portions of the paranasal sinuses and mastoid air cells are unremarkable.  The osseous structures are unremarkable.  IMPRESSION: Normal CT of the brain without intravenous contrast.  These results were called by telephone at the time of interpretation on 12/26/2014 at 9:13 am to Dr. Cyril Mourning, who verbally acknowledged these results.   Electronically Signed   By: Elige Ko   On: 12/26/2014 09:13   Mr Maxine Glenn Head Wo Contrast  12/26/2014   CLINICAL DATA:  Sudden onset of headache and visual changes at 7:30 a.m. today.  EXAM: MRI HEAD WITHOUT CONTRAST  MRA HEAD WITHOUT CONTRAST  TECHNIQUE: Multiplanar, multiecho pulse sequences of the brain and surrounding structures were obtained without intravenous contrast. Angiographic images of the head were obtained using MRA technique without contrast.  COMPARISON:  CT of the head 12/26/2014  FINDINGS: MRI HEAD FINDINGS  The diffusion-weighted images demonstrate no evidence for acute or subacute infarction. No acute hemorrhage or mass lesion is present. The ventricles are of normal size. No significant white matter disease is present. Insert pass fluid  Flow is present in the major  intracranial arteries. The globes and orbits are intact. Mild mucosal thickening is present in the paranasal sinuses and ethmoid air cells. The remaining paranasal sinuses are clear. The mastoid air cells are clear. The skullbase is within normal limits.  MRA HEAD FINDINGS  The internal carotid arteries are within normal limits from the high cervical segments through the ICA termini. The A1 and M1 segments are normal. Anterior communicating artery is patent. The ACA and MCA branch vessels are within normal limits.  The left vertebral artery is the dominant vessel. The PICA origins are visualized and normal. A dominant left AICA vessel is noted. The basilar  artery is within normal limits. Both posterior cerebral arteries originate from the basilar tip. The PCA branch vessels are intact.  IMPRESSION: 1. Normal MRI of the brain. 2. Normal variant MRA circle of Willis without evidence for significant proximal stenosis, aneurysm, or branch vessel occlusion.   Electronically Signed   By: Marin Roberts M.D.   On: 12/26/2014 14:30   Mr Brain Wo Contrast  12/26/2014   CLINICAL DATA:  Sudden onset of headache and visual changes at 7:30 a.m. today.  EXAM: MRI HEAD WITHOUT CONTRAST  MRA HEAD WITHOUT CONTRAST  TECHNIQUE: Multiplanar, multiecho pulse sequences of the brain and surrounding structures were obtained without intravenous contrast. Angiographic images of the head were obtained using MRA technique without contrast.  COMPARISON:  CT of the head 12/26/2014  FINDINGS: MRI HEAD FINDINGS  The diffusion-weighted images demonstrate no evidence for acute or subacute infarction. No acute hemorrhage or mass lesion is present. The ventricles are of normal size. No significant white matter disease is present. Insert pass fluid  Flow is present in the major intracranial arteries. The globes and orbits are intact. Mild mucosal thickening is present in the paranasal sinuses and ethmoid air cells. The remaining paranasal sinuses are clear. The mastoid air cells are clear. The skullbase is within normal limits.  MRA HEAD FINDINGS  The internal carotid arteries are within normal limits from the high cervical segments through the ICA termini. The A1 and M1 segments are normal. Anterior communicating artery is patent. The ACA and MCA branch vessels are within normal limits.  The left vertebral artery is the dominant vessel. The PICA origins are visualized and normal. A dominant left AICA vessel is noted. The basilar artery is within normal limits. Both posterior cerebral arteries originate from the basilar tip. The PCA branch vessels are intact.  IMPRESSION: 1. Normal MRI of the  brain. 2. Normal variant MRA circle of Willis without evidence for significant proximal stenosis, aneurysm, or branch vessel occlusion.   Electronically Signed   By: Marin Roberts M.D.   On: 12/26/2014 14:30     EKG Interpretation   Date/Time:  Friday December 26 2014 09:10:12 EDT Ventricular Rate:  72 PR Interval:  159 QRS Duration: 77 QT Interval:  382 QTC Calculation: 418 R Axis:   68 Text Interpretation:  Sinus rhythm ST elev, probable normal early repol  pattern Baseline wander in lead(s) I III aVR aVL aVF Confirmed by Keiosha Cancro   MD, Issaih Kaus ([id number]) on 12/26/2014 9:35:26 AM     After treatment in the ED the patient feels back to baseline and wants to go home. MDM   Final diagnoses:  Other migraine without status migrainosus, not intractable        Nelva Nay, MD 12/27/14 [phone]

## 2014-12-26 NOTE — ED Notes (Signed)
Pt in from work via [facility] per report pt was at work & had sudden onset HA & blurred vision :33 self reported, pt hx of HTN with not taking Lisinopril last night, pt had DM with CBG 156, takes metformin, -slurred speech, face symmetrical, equal grips & extremity strength bilaterally, Pt A&Ox4, NSR

## 2014-12-26 NOTE — Discharge Instructions (Signed)

## 2014-12-26 NOTE — Consult Note (Signed)
Referring Physician: Radford Pax    Chief Complaint: HA and blurred vision called as Code Stroke  HPI:                                                                                                                                         Kirk Cox is an 44 y.o. male presenting to ED as Code stroke.  He was driving his vehicle at 1610 when he had a sudden onset of bilateral frontal HA associated with blurred vision when looking in the distance and photophobia.  He rates HA as a 9/10 and constant.  He states he has had HA in past but nothing this intense.  He has no Dx of migraines and no migraine history in family. HE denies any weakness, numbness, double vision, slurred speech, language impairment, neck pain. CT head was personally reviewed and showed no acute abnormality. BP is 139/80.   Date last known well: Date: 12/26/2014 Time last known well: Time: 07:30 tPA Given: No: minimal symptoms Modified Rankin: Rankin Score=0    Past Medical History  Diagnosis Date  . Diabetes mellitus without complication     Past Surgical History  Procedure Laterality Date  . Cholecystectomy N/A 08/21/2012    Procedure: LAPAROSCOPIC CHOLECYSTECTOMY WITH INTRAOPERATIVE CHOLANGIOGRAM;  Surgeon: Wilmon Arms. Corliss Skains, MD;  Location: [facility];  Service: General;  Laterality: N/A;  . Umbilical hernia repair  08/21/2012    Procedure: HERNIA REPAIR UMBILICAL ADULT;  Surgeon: Wilmon Arms. Corliss Skains, MD;  Location: [facility];  Service: General;;  . Hernia repair      Family History  Problem Relation Age of Onset  . Diabetes Mother   . Diabetes Father    Social History:  reports that he has been smoking Cigarettes.  He has been smoking about 0.50 packs per day. He does not have any smokeless tobacco history on file. He reports that he drinks alcohol. He reports that he does not use illicit drugs.  Allergies: No Known Allergies  Medications:                                                                                                                            No current facility-administered medications for this encounter.   Current Outpatient Prescriptions  Medication Sig Dispense Refill  . metFORMIN (GLUCOPHAGE) 500 MG tablet Take 500 mg by mouth 2 (two)  times daily with a meal.    . ibuprofen (ADVIL,MOTRIN) 600 MG tablet Take 1 tablet (600 mg total) by mouth every 6 (six) hours as needed (mild pain). 30 tablet   . oxyCODONE-acetaminophen (PERCOCET/ROXICET) 5-325 MG per tablet Take 1-2 tablets by mouth every 4 (four) hours as needed. 60 tablet 0  . oxyCODONE-acetaminophen (PERCOCET/ROXICET) 5-325 MG per tablet Take 1 tablet by mouth every 4 (four) hours as needed for pain. 40 tablet 0     ROS:                                                                                                                                       History obtained from the patient  General ROS: negative for - chills, fatigue, fever, night sweats, weight gain or weight loss Psychological ROS: negative for - behavioral disorder, hallucinations, memory difficulties, mood swings or suicidal ideation Ophthalmic ROS: negative for - blurry vision, double vision, eye pain or loss of vision ENT ROS: negative for - epistaxis, nasal discharge, oral lesions, sore throat, tinnitus or vertigo Allergy and Immunology ROS: negative for - hives or itchy/watery eyes Hematological and Lymphatic ROS: negative for - bleeding problems, bruising or swollen lymph nodes Endocrine ROS: negative for - galactorrhea, hair pattern changes, polydipsia/polyuria or temperature intolerance Respiratory ROS: negative for - cough, hemoptysis, shortness of breath or wheezing Cardiovascular ROS: negative for - chest pain, dyspnea on exertion, edema or irregular heartbeat Gastrointestinal ROS: negative for - abdominal pain, diarrhea, hematemesis, nausea/vomiting or stool incontinence Genito-Urinary ROS: negative for - dysuria, hematuria, incontinence or urinary  frequency/urgency Musculoskeletal ROS: negative for - joint swelling or muscular weakness Neurological ROS: as noted in HPI Dermatological ROS: negative for rash and skin lesion changes  Physical Examination:                                                                                                      Blood pressure 123/67, pulse 68, temperature 97.9 F (36.6 C), temperature source Oral, resp. rate 20, SpO2 100 %.  HEENT-  Normocephalic, no lesions, without obvious abnormality.  Normal external eye and conjunctiva.  Normal TM's bilaterally.  Normal auditory canals and external ears. Normal external nose, mucus membranes and septum.  Normal pharynx. Cardiovascular- S1, S2 normal, pulses palpable throughout   Lungs- chest clear, no wheezing, rales, normal symmetric air entry Abdomen- normal findings: bowel sounds normal Extremities- no edema Lymph-no adenopathy palpable Musculoskeletal-no joint tenderness, deformity or swelling Skin-warm and dry, no hyperpigmentation, vitiligo,  or suspicious lesions  Neurological Examination Mental Status: Alert, oriented, thought content appropriate.  Speech fluent without evidence of aphasia.  Able to follow 3 step commands without difficulty. Cranial Nerves: II: Discs flat bilaterally; Visual fields grossly normal, pupils equal, round, reactive to light and accommodation III,IV, VI: ptosis not present, extra-ocular motions intact bilaterally V,VII: smile symmetric, facial light touch sensation normal bilaterally VIII: hearing normal bilaterally IX,X: uvula rises symmetrically XI: bilateral shoulder shrug XII: midline tongue extension Motor: Right : Upper extremity   5/5    Left:     Upper extremity   5/5  Lower extremity   5/5     Lower extremity   5/5 Tone and bulk:normal tone throughout; no atrophy noted Sensory: Pinprick and light touch intact throughout, bilaterally Deep Tendon Reflexes: 2+ and symmetric throughout Plantars: Right:  downgoing   Left: downgoing Cerebellar: normal finger-to-nose,  and normal heel-to-shin test Gait: not tested due to pain       Lab Results: Basic Metabolic Panel:  Recent Labs Lab 12/26/14 0909  NA 139  K 3.6  CL 104  GLUCOSE 166*  BUN 11  CREATININE 0.90    Liver Function Tests: No results for input(s): AST, ALT, ALKPHOS, BILITOT, PROT, ALBUMIN in the last 168 hours. No results for input(s): LIPASE, AMYLASE in the last 168 hours. No results for input(s): AMMONIA in the last 168 hours.  CBC:  Recent Labs Lab 12/26/14 0858 12/26/14 0909  WBC 5.0  --   NEUTROABS 2.2  --   HGB 14.9 15.6  HCT 43.2 46.0  MCV 78.3  --   PLT 227  --     Cardiac Enzymes: No results for input(s): CKTOTAL, CKMB, CKMBINDEX, TROPONINI in the last 168 hours.  Lipid Panel: No results for input(s): CHOL, TRIG, HDL, CHOLHDL, VLDL, LDLCALC in the last 168 hours.  CBG:  Recent Labs Lab 12/26/14 0904  GLUCAP 160*    Microbiology: No results found for this or any previous visit.  Coagulation Studies:  Recent Labs  12/26/14 0858  LABPROT 13.5  INR 1.01    Imaging: Ct Head Wo Contrast  12/26/2014   CLINICAL DATA:  Code stroke, severe headache.  Blurred vision.  EXAM: CT HEAD WITHOUT CONTRAST  TECHNIQUE: Contiguous axial images were obtained from the base of the skull through the vertex without intravenous contrast.  COMPARISON:  None.  FINDINGS: There is a small metallic foreign body in the left parietal and occipital scalp with beam hardening artifact which partially obscures the left parietal lobe.  There is no evidence of mass effect, midline shift or extra-axial fluid collections. There is no evidence of a space-occupying lesion or intracranial hemorrhage. There is no evidence of a cortical-based area of acute infarction.  The ventricles and sulci are appropriate for the patient's age. The basal cisterns are patent.  Visualized portions of the orbits are unremarkable. The  visualized portions of the paranasal sinuses and mastoid air cells are unremarkable.  The osseous structures are unremarkable.  IMPRESSION: Normal CT of the brain without intravenous contrast.  These results were called by telephone at the time of interpretation on 12/26/2014 at 9:13 am to Dr. Cyril Mourning, who verbally acknowledged these results.   Electronically Signed   By: Elige Ko   On: 12/26/2014 09:13       Assessment and plan discussed with with attending physician and they are in agreement.    Felicie Morn PA-C [facility] [phone]  12/26/2014, 9:32 AM   Assessment: 43  y.o. male with new onset bifrontal HA associated with photophobia and blurred vision when looking in the distance.  Exam is non-focal. At this time likely Dx is migraine HA but with his young age and thus would recommend MRI/MRA to evaluate for intracranial abnormalities.   Stroke Risk Factors - diabetes mellitus  Recommend: 1) MRI/MRA head--if negative no further stroke work up 2) Pain control  Patient seen and examined together with physician assistant and I concur with the assessment and plan.  Wyatt Portela, MD

## 2014-12-26 NOTE — Code Documentation (Signed)
44yo male arriving to [facility] via [facility] at [phone].  EMS reports that the patient had sudden onset headache and blurred (distance) vision at 0730.  Patient reports he was driving on his route when this occurred.  Patient has a h/o HTN and DM, and did not take his Lisinopril dose last night.  EMS called and activated a Code Stroke.  Stroke team at the bedside on arrival.  Labs drawn and patient cleared by Dr. Radford Pax.  Patient to CT.  Dr. Leroy Kennedy to the bedside.  NIHSS 0, see documentation for details and code stroke times.  Patient reports 10/10 headache.  No acute stroke treatment at this time.  Code stroke canceled at 0916.  Handoff with ED RN Toniann Fail.

## 2015-06-19 ENCOUNTER — Other Ambulatory Visit: Payer: Self-pay | Admitting: Nurse Practitioner

## 2015-06-19 ENCOUNTER — Ambulatory Visit
Admission: RE | Admit: 2015-06-19 | Discharge: 2015-06-19 | Disposition: A | Discharge status: Home / Self Care | Payer: Worker's Compensation | Source: Ambulatory Visit | Attending: Nurse Practitioner | Admitting: Nurse Practitioner

## 2015-06-19 DIAGNOSIS — M25572 Pain in left ankle and joints of left foot: Secondary | ICD-10-CM

## 2016-07-06 DIAGNOSIS — L309 Dermatitis, unspecified: Secondary | ICD-10-CM | POA: Diagnosis not present

## 2016-07-06 DIAGNOSIS — E119 Type 2 diabetes mellitus without complications: Secondary | ICD-10-CM | POA: Diagnosis not present

## 2016-07-06 DIAGNOSIS — Z7984 Long term (current) use of oral hypoglycemic drugs: Secondary | ICD-10-CM | POA: Diagnosis not present

## 2016-08-31 DIAGNOSIS — E119 Type 2 diabetes mellitus without complications: Secondary | ICD-10-CM | POA: Diagnosis not present

## 2016-11-02 DIAGNOSIS — E785 Hyperlipidemia, unspecified: Secondary | ICD-10-CM | POA: Diagnosis not present

## 2016-11-02 DIAGNOSIS — Z0000 Encounter for general adult medical examination without abnormal findings: Secondary | ICD-10-CM | POA: Diagnosis not present

## 2016-11-02 DIAGNOSIS — E119 Type 2 diabetes mellitus without complications: Secondary | ICD-10-CM | POA: Diagnosis not present

## 2016-11-04 DIAGNOSIS — M1832 Unilateral post-traumatic osteoarthritis of first carpometacarpal joint, left hand: Secondary | ICD-10-CM | POA: Diagnosis not present

## 2016-11-04 DIAGNOSIS — M1812 Unilateral primary osteoarthritis of first carpometacarpal joint, left hand: Secondary | ICD-10-CM | POA: Diagnosis not present

## 2016-11-25 DIAGNOSIS — L0501 Pilonidal cyst with abscess: Secondary | ICD-10-CM | POA: Diagnosis not present

## 2016-11-25 DIAGNOSIS — L0591 Pilonidal cyst without abscess: Secondary | ICD-10-CM | POA: Diagnosis not present

## 2017-03-03 DIAGNOSIS — M1812 Unilateral primary osteoarthritis of first carpometacarpal joint, left hand: Secondary | ICD-10-CM | POA: Diagnosis not present

## 2017-05-03 DIAGNOSIS — I10 Essential (primary) hypertension: Secondary | ICD-10-CM | POA: Diagnosis not present

## 2017-05-03 DIAGNOSIS — E119 Type 2 diabetes mellitus without complications: Secondary | ICD-10-CM | POA: Diagnosis not present

## 2017-05-03 DIAGNOSIS — E785 Hyperlipidemia, unspecified: Secondary | ICD-10-CM | POA: Diagnosis not present

## 2017-05-24 DIAGNOSIS — L03012 Cellulitis of left finger: Secondary | ICD-10-CM | POA: Diagnosis not present

## 2017-08-17 DIAGNOSIS — M1812 Unilateral primary osteoarthritis of first carpometacarpal joint, left hand: Secondary | ICD-10-CM | POA: Diagnosis not present

## 2017-08-17 DIAGNOSIS — M189 Osteoarthritis of first carpometacarpal joint, unspecified: Secondary | ICD-10-CM | POA: Insufficient documentation

## 2017-10-23 IMAGING — CR DG ANKLE COMPLETE 3+V*L*
3 series · 3 of 3 positions shown · non-contrast
Comparison: None.

CLINICAL DATA: Recent inversion injury with ankle pain, initial
encounter

EXAM:
LEFT ANKLE COMPLETE - 3+ VIEW

[x ankle ap left]
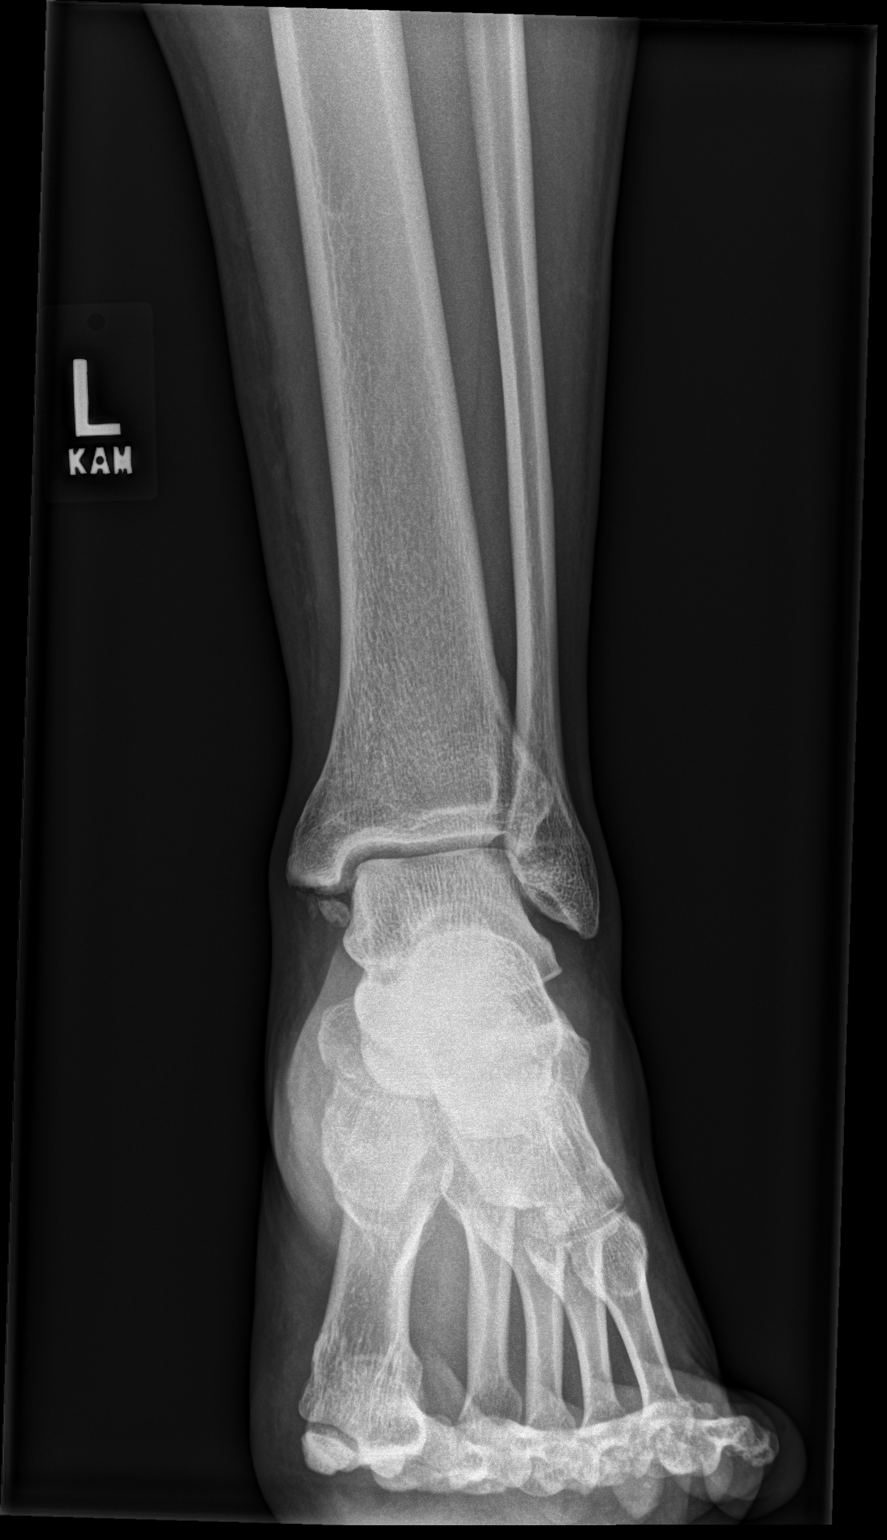

[x ankle obl left]
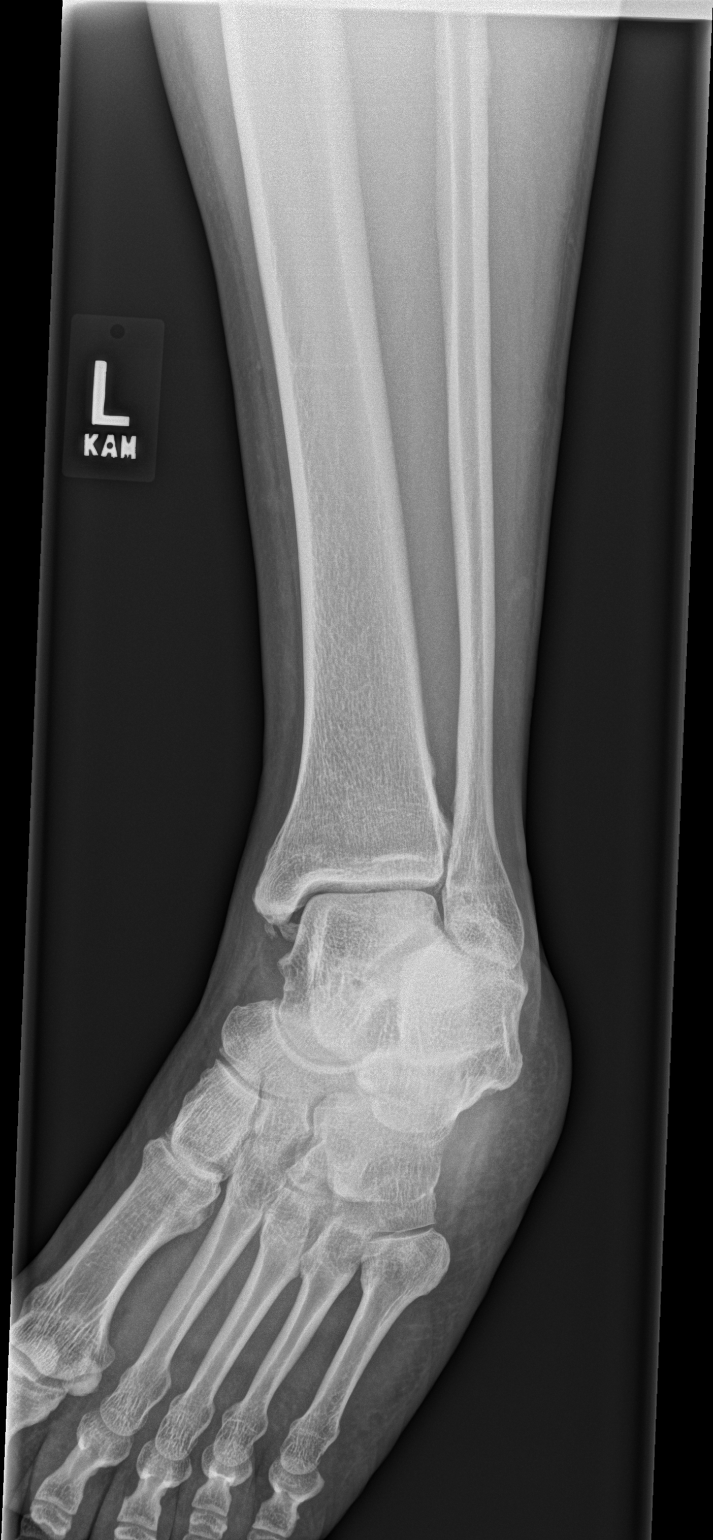

[x ankle lat left]
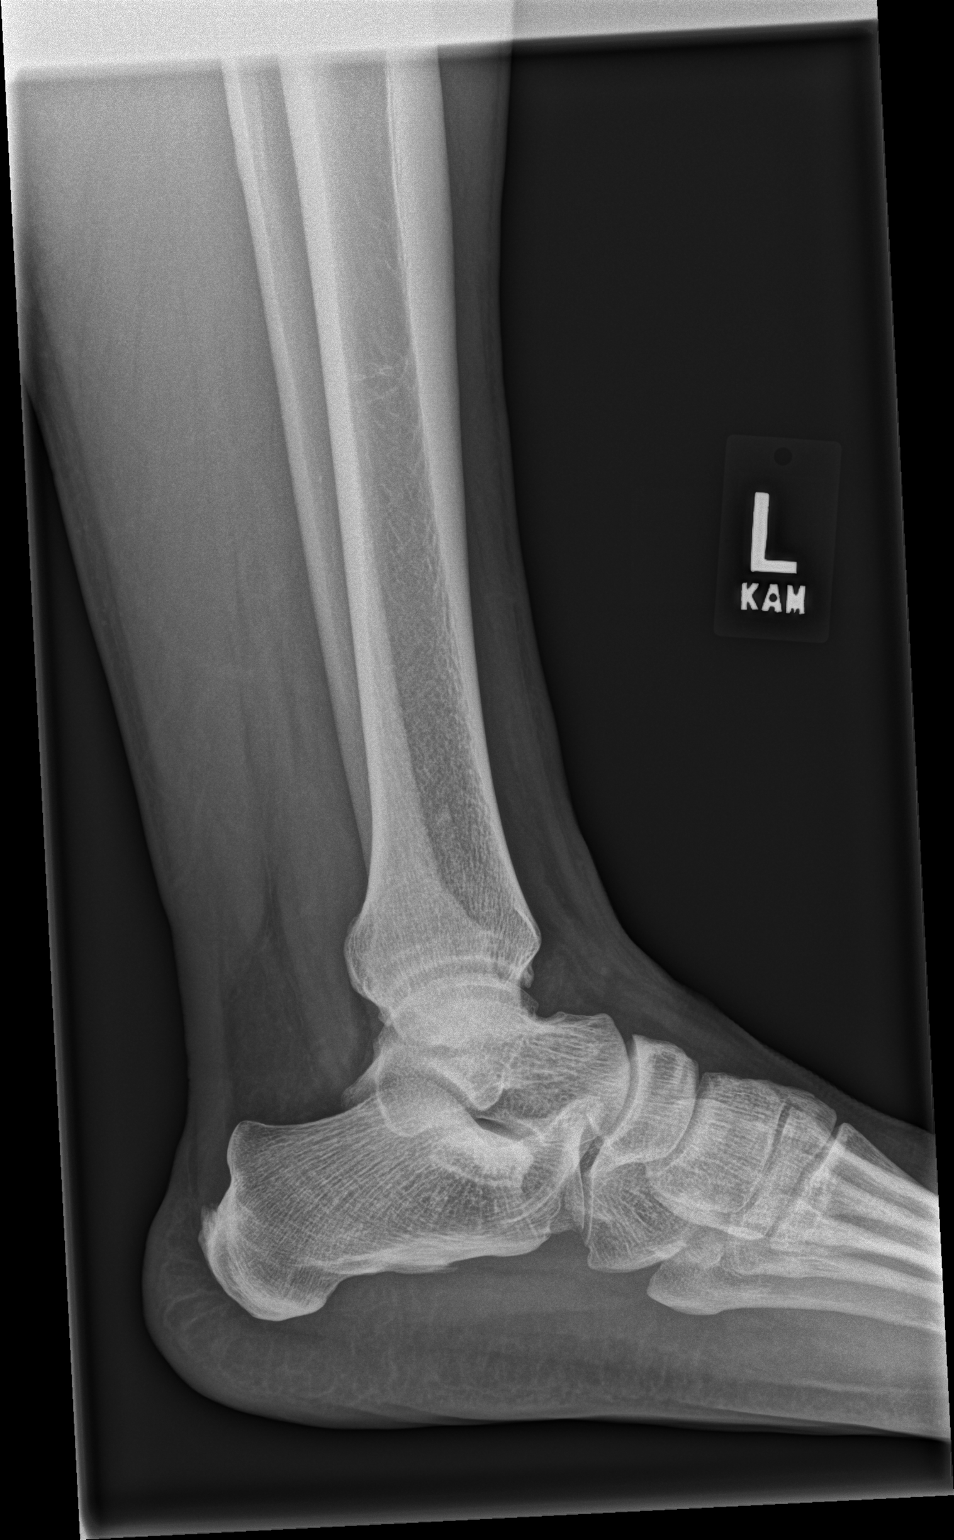

[3 of 3 positions shown; findings below may reference images not displayed]

FINDINGS: Multiple well corticated bony densities are noted adjacent to the
medial malleolus consistent with prior trauma and nonunion. No acute
fracture or dislocation is seen. No soft tissue abnormality is
noted.
IMPRESSION: Prior trauma.  No acute fracture is seen.

## 2017-10-30 DIAGNOSIS — M1812 Unilateral primary osteoarthritis of first carpometacarpal joint, left hand: Secondary | ICD-10-CM | POA: Diagnosis not present

## 2017-11-01 DIAGNOSIS — Z125 Encounter for screening for malignant neoplasm of prostate: Secondary | ICD-10-CM | POA: Diagnosis not present

## 2017-11-01 DIAGNOSIS — E119 Type 2 diabetes mellitus without complications: Secondary | ICD-10-CM | POA: Diagnosis not present

## 2017-11-01 DIAGNOSIS — Z0000 Encounter for general adult medical examination without abnormal findings: Secondary | ICD-10-CM | POA: Diagnosis not present

## 2017-11-01 DIAGNOSIS — E785 Hyperlipidemia, unspecified: Secondary | ICD-10-CM | POA: Diagnosis not present

## 2017-12-13 DIAGNOSIS — E119 Type 2 diabetes mellitus without complications: Secondary | ICD-10-CM | POA: Diagnosis not present

## 2018-03-14 ENCOUNTER — Ambulatory Visit
Admission: RE | Admit: 2018-03-14 | Discharge: 2018-03-14 | Disposition: A | Discharge status: Home / Self Care | Payer: No Typology Code available for payment source | Source: Ambulatory Visit | Attending: Family Medicine | Admitting: Family Medicine

## 2018-03-14 ENCOUNTER — Other Ambulatory Visit: Payer: Self-pay | Admitting: Family Medicine

## 2018-03-14 DIAGNOSIS — S6991XA Unspecified injury of right wrist, hand and finger(s), initial encounter: Secondary | ICD-10-CM

## 2018-03-14 DIAGNOSIS — M19031 Primary osteoarthritis, right wrist: Secondary | ICD-10-CM | POA: Diagnosis not present

## 2018-03-14 DIAGNOSIS — L03011 Cellulitis of right finger: Secondary | ICD-10-CM | POA: Diagnosis not present

## 2018-03-14 DIAGNOSIS — M25531 Pain in right wrist: Secondary | ICD-10-CM | POA: Diagnosis not present

## 2018-04-10 DIAGNOSIS — M1812 Unilateral primary osteoarthritis of first carpometacarpal joint, left hand: Secondary | ICD-10-CM | POA: Diagnosis not present

## 2018-05-16 ENCOUNTER — Encounter: Payer: Self-pay | Admitting: Skilled Nursing Facility1

## 2018-05-16 DIAGNOSIS — E119 Type 2 diabetes mellitus without complications: Secondary | ICD-10-CM | POA: Diagnosis not present

## 2018-05-16 DIAGNOSIS — I10 Essential (primary) hypertension: Secondary | ICD-10-CM | POA: Diagnosis not present

## 2018-05-16 DIAGNOSIS — E785 Hyperlipidemia, unspecified: Secondary | ICD-10-CM | POA: Diagnosis not present

## 2018-06-06 ENCOUNTER — Encounter: Payer: Self-pay | Admitting: Skilled Nursing Facility1

## 2018-06-06 ENCOUNTER — Encounter: Payer: 59 | Attending: Family Medicine | Admitting: Skilled Nursing Facility1

## 2018-06-06 DIAGNOSIS — E119 Type 2 diabetes mellitus without complications: Secondary | ICD-10-CM | POA: Insufficient documentation

## 2018-06-06 NOTE — Progress Notes (Signed)
Diabetes Self-Management Education  Visit Type: First/Initial   06/07/2018  Kirk Cox, identified by name and date of birth, is a 48 y.o. male with a diagnosis of Diabetes: Type 2.   ASSESSMENT  Height 5' 6.5" (1.689 m), weight 215 lb (97.5 kg). Body mass index is 34.18 kg/m. Pt states his physician is going to give him 1 month to get his blood sugar down before he is prescribed insulin.  Pt states in the past 2 weeks he has cut out sweets and walking more. Pt states he just recently misplaced his meter.   Pt cannot recall any of the blood sugar numbers he has gotten.  Pts A1C 14. Diabetes Self-Management Education - 06/06/18 1441      Visit Information   Visit Type  First/Initial      Initial Visit   Diabetes Type  Type 2    Are you currently following a meal plan?  No    Are you taking your medications as prescribed?  Yes      Health Coping   How would you rate your overall health?  Good      Psychosocial Assessment   Patient Belief/Attitude about Diabetes  Motivated to manage diabetes    Self-management support  Family    Other persons present  Spouse/SO      Pre-Education Assessment   Patient understands the diabetes disease and treatment process.  Demonstrates understanding / competency    Patient understands incorporating nutritional management into lifestyle.  Demonstrates understanding / competency    Patient undertands incorporating physical activity into lifestyle.  Demonstrates understanding / competency    Patient understands using medications safely.  Demonstrates understanding / competency    Patient understands monitoring blood glucose, interpreting and using results  Demonstrates understanding / competency    Patient understands prevention, detection, and treatment of acute complications.  Demonstrates understanding / competency    Patient understands prevention, detection, and treatment of chronic complications.  Demonstrates understanding /  competency    Patient understands how to develop strategies to address psychosocial issues.  Demonstrates understanding / competency    Patient understands how to develop strategies to promote health/change behavior.  Demonstrates understanding / competency      Complications   Last HgB A1C per patient/outside source  14 %    How often do you check your blood sugar?  1-2 times/day    Number of hypoglycemic episodes per month  0    Number of hyperglycemic episodes per week  1    Can you tell when your blood sugar is high?  Yes    What do you do if your blood sugar is high?  drinks water    Have you had a dilated eye exam in the past 12 months?  Yes    Have you had a dental exam in the past 12 months?  Yes    Are you checking your feet?  No      Dietary Intake   Breakfast  skipped or 2 toast or oatmeal    Snack (morning)  10 am coffee with splenda and baked chicken or pork rinds     Lunch  chicken salad and fruit    Snack (afternoon)  fruit     Dinner  cheesteak sub or chicken and brown rice and greens and bowl of cereal     Snack (evening)  sugar free candy     Beverage(s)  water, diet gingerale, gatorade zero, [place] green tea  Exercise   Exercise Type  Light (walking / raking leaves)    How many days per week to you exercise?  2    How many minutes per day do you exercise?  30    Total minutes per week of exercise  60      Patient Education   Previous Diabetes Education  No    Disease state   Definition of diabetes, type 1 and 2, and the diagnosis of diabetes;Factors that contribute to the development of diabetes;Explored patient's options for treatment of their diabetes    Nutrition management   Food label reading, portion sizes and measuring food.;Role of diet in the treatment of diabetes and the relationship between the three main macronutrients and blood glucose level;Carbohydrate counting;Meal options for control of blood glucose level and chronic  complications.;Information on hints to eating out and maintain blood glucose control.;Reviewed blood glucose goals for pre and post meals and how to evaluate the patients' food intake on their blood glucose level.    Physical activity and exercise   Identified with patient nutritional and/or medication changes necessary with exercise.;Role of exercise on diabetes management, blood pressure control and cardiac health.;Helped patient identify appropriate exercises in relation to his/her diabetes, diabetes complications and other health issue.    Medications  Reviewed patients medication for diabetes, action, purpose, timing of dose and side effects.    Monitoring  Taught/evaluated SMBG meter.;Purpose and frequency of SMBG.;Yearly dilated eye exam;Daily foot exams;Taught/discussed recording of test results and interpretation of SMBG.    Acute complications  Taught treatment of hypoglycemia - the 15 rule.;Discussed and identified patients' treatment of hyperglycemia.    Chronic complications  Relationship between chronic complications and blood glucose control;Dental care;Nephropathy, what it is, prevention of, the use of ACE, ARB's and early detection of through urine microalbumia.;Assessed and discussed foot care and prevention of foot problems;Retinopathy and reason for yearly dilated eye exams    Psychosocial adjustment  Role of stress on diabetes;Worked with patient to identify barriers to care and solutions      Individualized Goals (developed by patient)   Nutrition  Follow meal plan discussed;General guidelines for healthy choices and portions discussed    Physical Activity  Exercise 5-7 days per week;30 minutes per day;15 minutes per day      Post-Education Assessment   Patient understands the diabetes disease and treatment process.  Demonstrates understanding / competency    Patient understands incorporating nutritional management into lifestyle.  Demonstrates understanding / competency     Patient undertands incorporating physical activity into lifestyle.  Demonstrates understanding / competency    Patient understands using medications safely.  Demonstrates understanding / competency    Patient understands monitoring blood glucose, interpreting and using results  Demonstrates understanding / competency    Patient understands prevention, detection, and treatment of acute complications.  Demonstrates understanding / competency    Patient understands prevention, detection, and treatment of chronic complications.  Demonstrates understanding / competency    Patient understands how to develop strategies to address psychosocial issues.  Demonstrates understanding / competency    Patient understands how to develop strategies to promote health/change behavior.  Demonstrates understanding / competency      Outcomes   Expected Outcomes  Demonstrated interest in learning. Expect positive outcomes    Future DMSE  PRN    Program Status  Completed       Individualized Plan for Diabetes Self-Management Training:   Learning Objective:  Patient will have a greater understanding of diabetes  self-management. Patient education plan is to attend individual and/or group sessions per assessed needs and concerns.   Plan:   There are no Patient Instructions on file for this visit.  Expected Outcomes:  Demonstrated interest in learning. Expect positive outcomes  Education material provided: ADA Diabetes: Your Take Control Guide, Meal plan card, My Plate, Snack sheet and Support group flyer  If problems or questions, patient to contact team via:  Phone  Future DSME appointment: PRN

## 2018-06-18 DIAGNOSIS — E1165 Type 2 diabetes mellitus with hyperglycemia: Secondary | ICD-10-CM | POA: Diagnosis not present

## 2018-06-27 DIAGNOSIS — M1812 Unilateral primary osteoarthritis of first carpometacarpal joint, left hand: Secondary | ICD-10-CM | POA: Diagnosis not present

## 2018-06-27 DIAGNOSIS — G8918 Other acute postprocedural pain: Secondary | ICD-10-CM | POA: Diagnosis not present

## 2018-06-27 DIAGNOSIS — M13842 Other specified arthritis, left hand: Secondary | ICD-10-CM | POA: Diagnosis not present

## 2018-07-03 DIAGNOSIS — M1812 Unilateral primary osteoarthritis of first carpometacarpal joint, left hand: Secondary | ICD-10-CM | POA: Diagnosis not present

## 2018-07-10 DIAGNOSIS — M13842 Other specified arthritis, left hand: Secondary | ICD-10-CM | POA: Diagnosis not present

## 2018-07-10 DIAGNOSIS — Z4789 Encounter for other orthopedic aftercare: Secondary | ICD-10-CM | POA: Insufficient documentation

## 2018-07-18 ENCOUNTER — Ambulatory Visit: Payer: Self-pay | Admitting: Skilled Nursing Facility1

## 2018-07-18 DIAGNOSIS — M13842 Other specified arthritis, left hand: Secondary | ICD-10-CM | POA: Diagnosis not present

## 2018-08-09 DIAGNOSIS — M13842 Other specified arthritis, left hand: Secondary | ICD-10-CM | POA: Diagnosis not present

## 2018-08-23 DIAGNOSIS — E1165 Type 2 diabetes mellitus with hyperglycemia: Secondary | ICD-10-CM | POA: Diagnosis not present

## 2018-09-04 DIAGNOSIS — M13842 Other specified arthritis, left hand: Secondary | ICD-10-CM | POA: Diagnosis not present

## 2018-09-11 DIAGNOSIS — M25642 Stiffness of left hand, not elsewhere classified: Secondary | ICD-10-CM | POA: Diagnosis not present

## 2018-09-13 DIAGNOSIS — M25642 Stiffness of left hand, not elsewhere classified: Secondary | ICD-10-CM | POA: Diagnosis not present

## 2018-09-17 DIAGNOSIS — M25642 Stiffness of left hand, not elsewhere classified: Secondary | ICD-10-CM | POA: Diagnosis not present

## 2018-09-20 DIAGNOSIS — M13842 Other specified arthritis, left hand: Secondary | ICD-10-CM | POA: Diagnosis not present

## 2018-09-20 DIAGNOSIS — M25642 Stiffness of left hand, not elsewhere classified: Secondary | ICD-10-CM | POA: Diagnosis not present

## 2018-09-27 DIAGNOSIS — M1812 Unilateral primary osteoarthritis of first carpometacarpal joint, left hand: Secondary | ICD-10-CM | POA: Diagnosis not present

## 2018-10-04 DIAGNOSIS — Z4789 Encounter for other orthopedic aftercare: Secondary | ICD-10-CM | POA: Diagnosis not present

## 2019-03-25 ENCOUNTER — Other Ambulatory Visit: Payer: Self-pay | Admitting: *Deleted

## 2019-03-25 DIAGNOSIS — Z20822 Contact with and (suspected) exposure to covid-19: Secondary | ICD-10-CM

## 2019-03-26 LAB — NOVEL CORONAVIRUS, NAA: SARS-CoV-2, NAA: NOT DETECTED

## 2019-06-05 ENCOUNTER — Other Ambulatory Visit: Payer: Self-pay

## 2019-06-05 DIAGNOSIS — Z20822 Contact with and (suspected) exposure to covid-19: Secondary | ICD-10-CM

## 2019-06-06 LAB — NOVEL CORONAVIRUS, NAA: SARS-CoV-2, NAA: NOT DETECTED

## 2019-06-08 ENCOUNTER — Telehealth: Payer: Self-pay | Admitting: Family Medicine

## 2019-06-08 NOTE — Telephone Encounter (Signed)
Pt aware covid lab test negative, not detected °

## 2019-06-17 ENCOUNTER — Encounter: Payer: Self-pay | Admitting: Podiatry

## 2019-06-17 ENCOUNTER — Other Ambulatory Visit: Payer: Self-pay

## 2019-06-17 ENCOUNTER — Ambulatory Visit (INDEPENDENT_AMBULATORY_CARE_PROVIDER_SITE_OTHER): Payer: 59 | Admitting: Podiatry

## 2019-06-17 VITALS — Temp 98.0°F

## 2019-06-17 DIAGNOSIS — L600 Ingrowing nail: Secondary | ICD-10-CM

## 2019-06-17 DIAGNOSIS — E1140 Type 2 diabetes mellitus with diabetic neuropathy, unspecified: Secondary | ICD-10-CM | POA: Diagnosis not present

## 2019-06-17 DIAGNOSIS — E1149 Type 2 diabetes mellitus with other diabetic neurological complication: Secondary | ICD-10-CM

## 2019-06-17 NOTE — Progress Notes (Signed)
   Subjective:    Patient ID: Kirk Cox, male    DOB: 1971-02-27, 49 y.o.   MRN: [id number]  HPI    Review of Systems  All other systems reviewed and are negative.      Objective:   Physical Exam        Assessment & Plan:

## 2019-06-18 NOTE — Progress Notes (Signed)
Subjective:   Patient ID: Kirk Cox, male   DOB: 49 y.o.   MRN: [id number]   HPI Patient presents stating he damaged his left fourth nail bed and its partially loosened and hard for him to wear shoe gear comfortably.  States it is become more of an issue over time and also has trouble with all his nails in general.  Patient does have diabetes and is not in good control and is promising to take better care of himself   Review of Systems  All other systems reviewed and are negative.       Objective:  Physical Exam Vitals and nursing note reviewed.  Constitutional:      Appearance: He is well-developed.  Pulmonary:     Effort: Pulmonary effort is normal.  Musculoskeletal:        General: Normal range of motion.  Skin:    General: Skin is warm.  Neurological:     Mental Status: He is alert.     Neurovascular status was found to be intact with patient noted to have a damaged fourth nail left that is been traumatized and is partially loosened from the underlying bed.  All nails have some thickness in them but no trauma like the fourth nail left.  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  I did discuss anesthesia and offered this to him but he refuses it stating he would rather take the pain.  I flushed the bed applied sterile dressing instructed on soaks and debrided remaining nails.  Explained it may not grow normally and may eventually require permanent procedure depending on how it regrows

## 2019-07-16 ENCOUNTER — Other Ambulatory Visit: Payer: Self-pay

## 2019-07-16 DIAGNOSIS — Z20822 Contact with and (suspected) exposure to covid-19: Secondary | ICD-10-CM

## 2019-07-17 LAB — NOVEL CORONAVIRUS, NAA: SARS-CoV-2, NAA: NOT DETECTED

## 2020-07-06 ENCOUNTER — Other Ambulatory Visit: Payer: Self-pay | Admitting: Family Medicine

## 2020-07-06 ENCOUNTER — Ambulatory Visit
Admission: RE | Admit: 2020-07-06 | Discharge: 2020-07-06 | Disposition: A | Discharge status: Home / Self Care | Payer: 59 | Source: Ambulatory Visit | Attending: Family Medicine | Admitting: Family Medicine

## 2020-07-06 DIAGNOSIS — M79645 Pain in left finger(s): Secondary | ICD-10-CM

## 2020-07-18 IMAGING — CR DG WRIST 2V*R*
3 series · 3 of 3 positions shown · non-contrast
Comparison: None

CLINICAL DATA: Fell off of a mechanical bull in [place] 4 weeks
ago, pain over ulnar styloid of range of motion question fracture,
injury of RIGHT wrist initial encounter

EXAM:
RIGHT WRIST - 2 VIEW

[x wrist pa right]
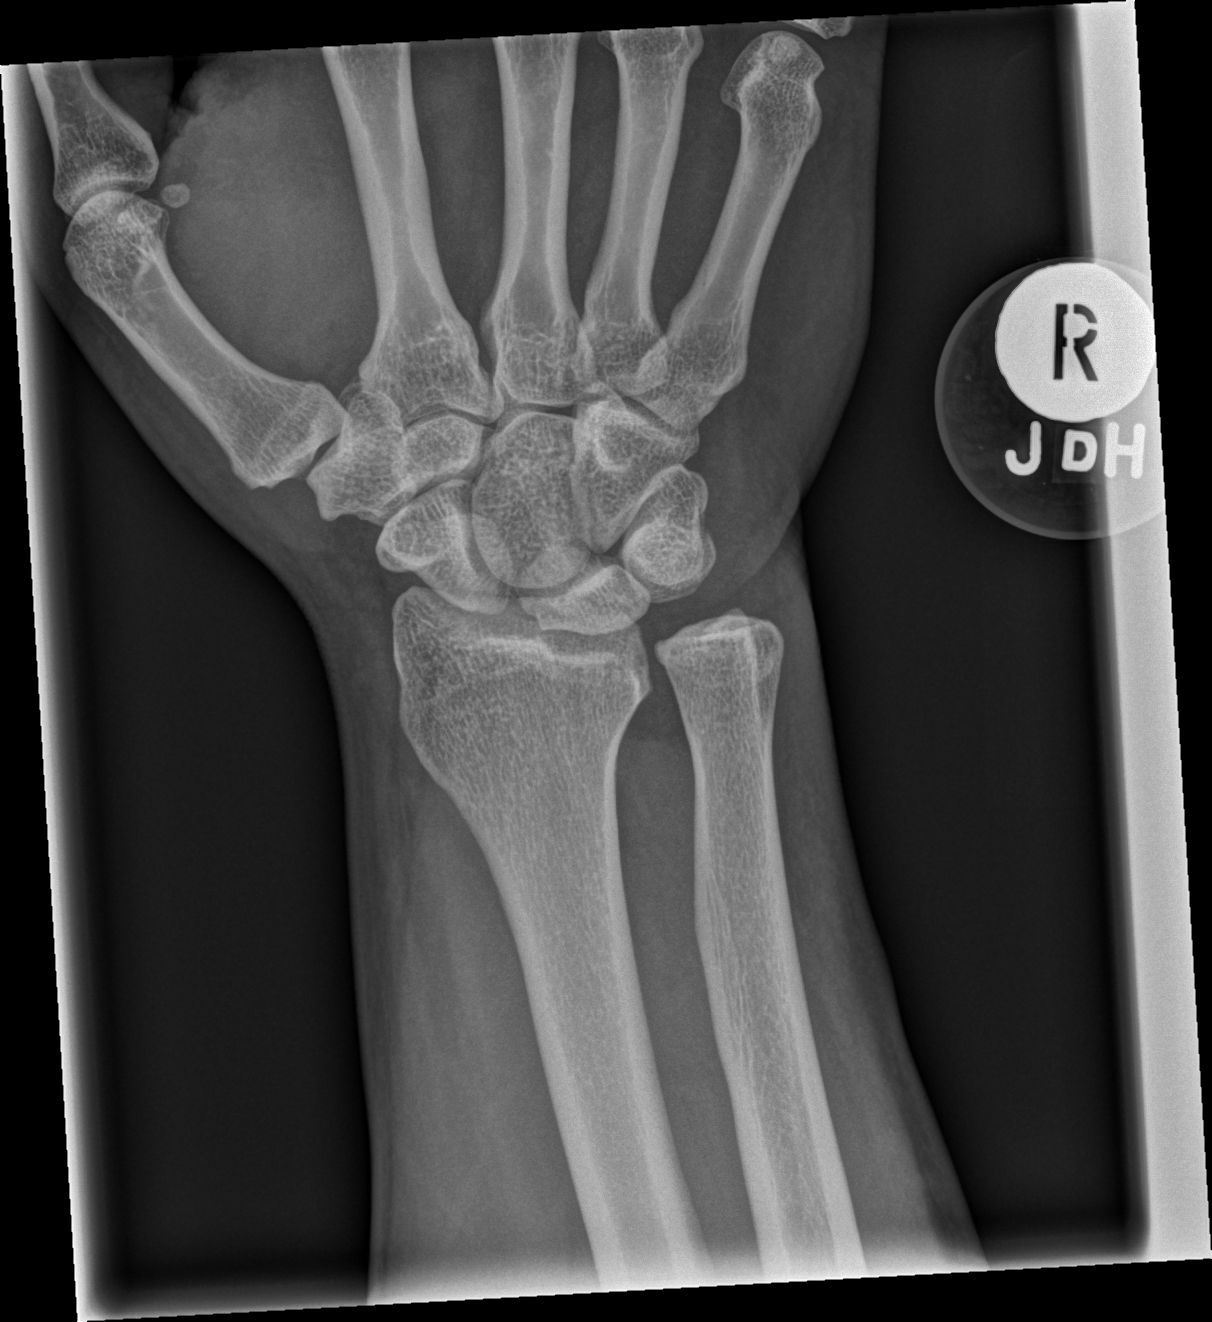

[x wrist lat right (1 of 2)]
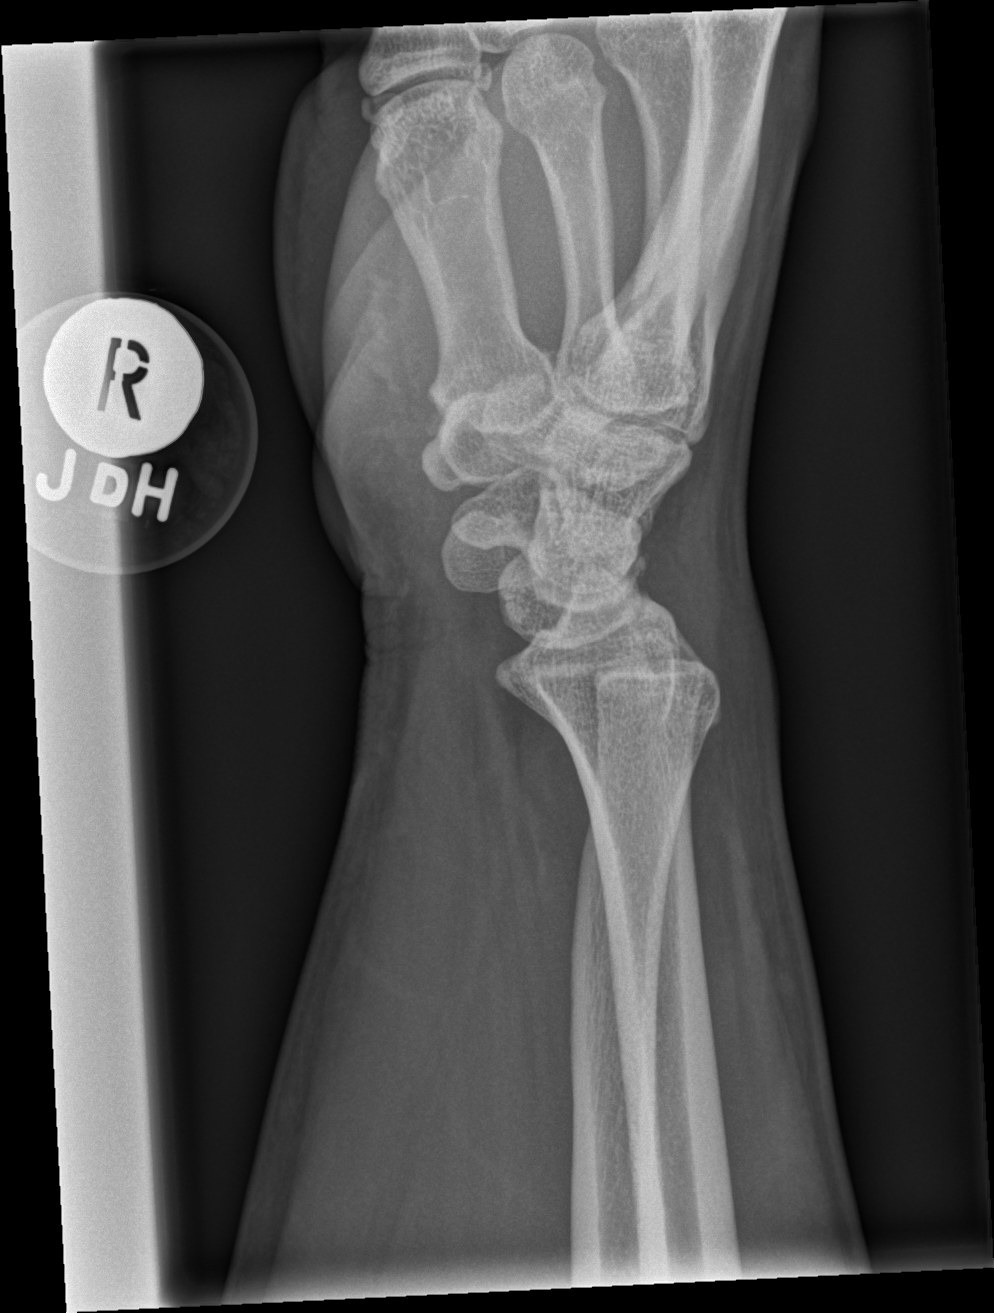

[x wrist lat right (2 of 2)]
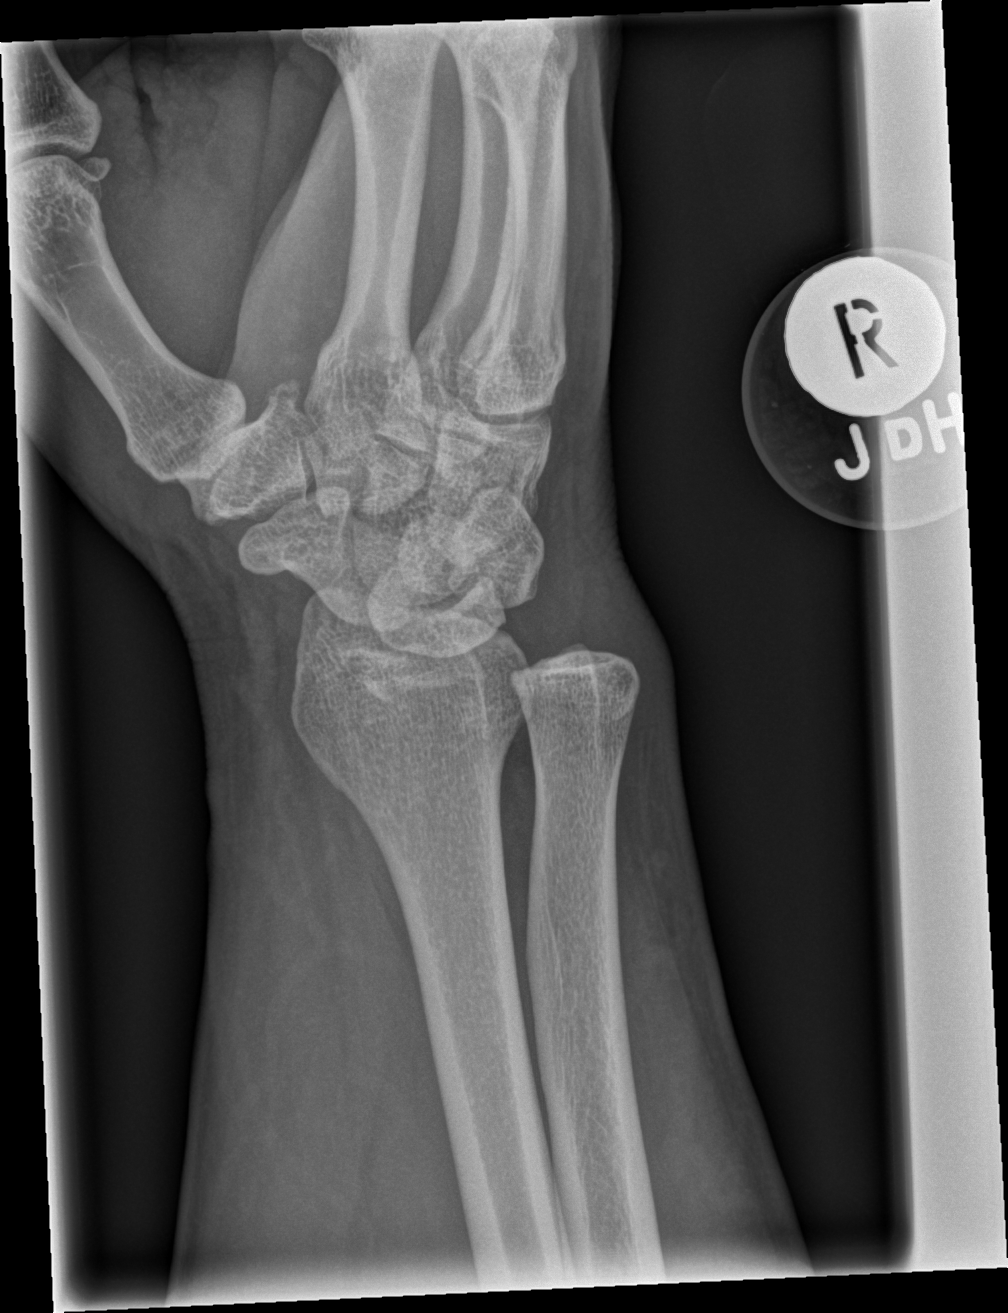

[3 of 3 positions shown; findings below may reference images not displayed]

FINDINGS: Osseous mineralization normal.

Joint spaces preserved.

Slight flattening of the radial aspect of the lunate, appears old,
question developmental versus sequela of remote injury.

No acute fracture, dislocation, or bone destruction.
IMPRESSION: No acute osseous abnormalities.

## 2020-12-28 ENCOUNTER — Other Ambulatory Visit: Payer: Self-pay

## 2020-12-28 ENCOUNTER — Encounter: Payer: Self-pay | Admitting: Internal Medicine

## 2020-12-28 ENCOUNTER — Ambulatory Visit: Payer: 59 | Admitting: Internal Medicine

## 2020-12-28 VITALS — BP 132/90 | HR 74 | Ht 66.5 in | Wt 206.0 lb

## 2020-12-28 DIAGNOSIS — E785 Hyperlipidemia, unspecified: Secondary | ICD-10-CM | POA: Diagnosis not present

## 2020-12-28 DIAGNOSIS — R739 Hyperglycemia, unspecified: Secondary | ICD-10-CM | POA: Diagnosis not present

## 2020-12-28 DIAGNOSIS — E1165 Type 2 diabetes mellitus with hyperglycemia: Secondary | ICD-10-CM

## 2020-12-28 LAB — POCT GLYCOSYLATED HEMOGLOBIN (HGB A1C): Hemoglobin A1C: 11.3 % — AB (ref 4.0–5.6)

## 2020-12-28 MED ORDER — METFORMIN HCL 500 MG PO TABS: 1000.0000 mg | ORAL_TABLET | Freq: Two times a day (BID) | ORAL | 3 refills | Status: DC

## 2020-12-28 MED ORDER — GLIPIZIDE 5 MG PO TABS: 5.0000 mg | ORAL_TABLET | Freq: Two times a day (BID) | ORAL | 1 refills | Status: DC

## 2020-12-28 MED ORDER — EMPAGLIFLOZIN 25 MG PO TABS: 25.0000 mg | ORAL_TABLET | Freq: Every day | ORAL | 1 refills | Status: DC

## 2020-12-28 NOTE — Patient Instructions (Addendum)
-   Metformin 500 mg , 2 Tablets with Breakfast and Two tablet tablets with Supper  - Increase Jardiance to 25 mg, 1 tablet in the morning  - STOP Glipizide XL  - START glipizide 5 mg 1 tablet before Breakfast and 1 tablet before supper      HOW TO TREAT LOW BLOOD SUGARS (Blood sugar LESS THAN 70 MG/DL) Please follow the RULE OF 15 for the treatment of hypoglycemia treatment (when your (blood sugars are less than 70 mg/dL)   STEP 1: Take 15 grams of carbohydrates when your blood sugar is low, which includes:  3-4 GLUCOSE TABS  OR 3-4 OZ OF JUICE OR REGULAR SODA OR ONE TUBE OF GLUCOSE GEL    STEP 2: RECHECK blood sugar in 15 MINUTES STEP 3: If your blood sugar is still low at the 15 minute recheck --> then, go back to STEP 1 and treat AGAIN with another 15 grams of carbohydrates.

## 2020-12-28 NOTE — Progress Notes (Signed)
Name: Kirk Cox  MRN/ DOB: [id number], [birth year]   Age/ Sex: 50 y.o., male    PCP: Farris Has, MD   Reason for Endocrinology Evaluation: Type 2 Diabetes Mellitus     Date of Initial Endocrinology Visit: 12/28/2020     PATIENT IDENTIFIER: [name] is a 50 y.o. male with a past medical history of T2DM, HTN and Dyslipidemia. The patient presented for initial endocrinology clinic visit on 12/28/2020 for consultative assistance with his diabetes management.    HPI: Kirk Cox was    Diagnosed with DM 2019 Prior Medications tried/Intolerance: As listed Currently checking blood sugars occasionally   Hypoglycemia episodes : no          Hemoglobin A1c has ranged from 6.7% in 02/2020, peaking at 12.2% in 2022. Patient required assistance for hypoglycemia: no Patient has required hospitalization within the last 1 year from hyper or hypoglycemia: no  In terms of diet, the patient eats 2 -3 meals a day , snacks 3-4 x a day .    HOME DIABETES REGIMEN: Metformin 500 mg 2 tabs BID - taking 2 tablets in the morning only  Glipizide ER 5 mg daily  Jardiance 10 mg daily    Statin: yes ACE-I/ARB: yes Prior Diabetic Education: yes    METER DOWNLOAD SUMMARY: Did not bring    DIABETIC COMPLICATIONS: Microvascular complications:    Denies: CKD, retinopathy, neuropathy  Last eye exam: Completed 04/2020  Macrovascular complications:   Denies: CAD, PVD, CVA   PAST HISTORY: Past Medical History:  Past Medical History:  Diagnosis Date   Diabetes mellitus without complication (HCC)    Past Surgical History:  Past Surgical History:  Procedure Laterality Date   CHOLECYSTECTOMY N/A 08/21/2012   Procedure: LAPAROSCOPIC CHOLECYSTECTOMY WITH INTRAOPERATIVE CHOLANGIOGRAM;  Surgeon: Wilmon Arms. Corliss Skains, MD;  Location: [facility];  Service: General;  Laterality: N/A;   HERNIA REPAIR     UMBILICAL HERNIA REPAIR  08/21/2012   Procedure: HERNIA REPAIR UMBILICAL ADULT;  Surgeon:  Wilmon Arms. Corliss Skains, MD;  Location: [facility];  Service: General;;    Social History:  reports that he has been smoking cigarettes. He has been smoking an average of .5 packs per day. He has never used smokeless tobacco. He reports current alcohol use. He reports that he does not use drugs. Family History:  Family History  Problem Relation Age of Onset   Diabetes Mother    Diabetes Father      HOME MEDICATIONS: Allergies as of 12/28/2020   No Known Allergies      Medication List        Accurate as of December 28, 2020 10:14 AM. If you have any questions, ask your nurse or doctor.          glucose blood test strip OneTouch Verio test strips   ibuprofen 600 MG tablet Commonly known as: ADVIL Take 1 tablet (600 mg total) by mouth every 6 (six) hours as needed (mild pain).   lisinopril 5 MG tablet Commonly known as: ZESTRIL Take 5 mg by mouth daily.   metFORMIN 500 MG tablet Commonly known as: GLUCOPHAGE Take 500 mg by mouth 2 (two) times daily with a meal.   pravastatin 40 MG tablet Commonly known as: PRAVACHOL pravastatin 40 mg tablet  TAKE 1 TABLET BY MOUTH EVERY DAY   sildenafil 50 MG tablet Commonly known as: VIAGRA Take 50 mg by mouth daily as needed.   valACYclovir 500 MG tablet Commonly known as: VALTREX valacyclovir 500 mg  tablet         ALLERGIES: No Known Allergies   REVIEW OF SYSTEMS: A comprehensive ROS was conducted with the patient and is negative except as per HPI and below:  Review of Systems  Gastrointestinal:  Negative for diarrhea and nausea.  Genitourinary:  Positive for frequency.  Endo/Heme/Allergies:  Positive for polydipsia.     OBJECTIVE:   VITAL SIGNS: BP 132/90   Pulse 74   Ht 5' 6.5" (1.689 m)   Wt 206 lb (93.4 kg)   SpO2 99%   BMI 32.75 kg/m    PHYSICAL EXAM:  General: Pt appears well and is in NAD  Neck: General: Supple without adenopathy or carotid bruits. Thyroid: Thyroid size normal.  No goiter or nodules  appreciated. No thyroid bruit.  Lungs: Clear with good BS bilat with no rales, rhonchi, or wheezes  Heart: RRR with normal S1 and S2 and no gallops; no murmurs; no rub  Abdomen: Normoactive bowel sounds, soft, nontender, without masses or organomegaly palpable  Extremities:  Lower extremities - No pretibial edema. No lesions.  Skin: Normal texture and temperature to palpation. No rash noted. No Acanthosis nigricans/skin tags. No lipohypertrophy.  Neuro: MS is good with appropriate affect, pt is alert and Ox3    DM foot exam:    DATA REVIEWED:  12/16/2020 Gluc 319 BUN/CR 14/1.04 GFR 86 Ca 10.3 A1c 12.2% Tg 101 HDL 45 LDL 149 MA/Cr 109.6     ASSESSMENT / PLAN / RECOMMENDATIONS:   1) Type 2 Diabetes Mellitus, Poorly controlled, With out complications - Most recent A1c of 12.2 %. Goal A1c < 7.0 %.    Plan: GENERAL: I have discussed with the patient the pathophysiology of diabetes. We went over the natural progression of the disease. We talked about both insulin resistance and insulin deficiency. We stressed the importance of lifestyle changes including diet and exercise. I explained the complications associated with diabetes including retinopathy, nephropathy, neuropathy as well as increased risk of cardiovascular disease. We went over the benefit seen with glycemic control.   I explained to the patient that diabetic patients are at higher than normal risk for amputations.  Poorly controlled diabetes due to dietary indiscretion and medication nonadherence We have discussed the importance of avoiding snacks, we also discussed low-carb snacks if necessary He was advised to avoid all sugar sweetened beverages He was offered insulin through his PCPs office but he declines I am going to switch his glipizide XL to regular glipizide as below I am also going to increase his Jardiance  MEDICATIONS: Continue metformin 500 mg, 2 tabs twice daily Increase Jardiance to 25 mg, 1 tablet in  the morning  STOP Glipizide XL  START glipizide 5 mg 1 tablet before Breakfast and 1 tablet before supper  EDUCATION / INSTRUCTIONS: BG monitoring instructions: Patient is instructed to check his blood sugars 1 times a day, fasting. Call [facility] if: BG persistently < 70  I reviewed the Rule of 15 for the treatment of hypoglycemia in detail with the patient. Literature supplied.   2) Diabetic complications:  Eye: Does not have known diabetic retinopathy.  Neuro/ Feet: Does not have known diabetic peripheral neuropathy. Renal: Patient does not have known baseline CKD. He is not on an ACEI/ARB at present.   3) Dyslipidemia:  -His LDL above goal at 149 mg/DL, patient on pravastatin, patient assures me compliance, patient would like to work on lifestyle changes, he was advised to follow a low-fat diet  -No changes  today    Medication Continue pravastatin 40 mg daily       Signed electronically by: Lyndle Herrlich, MD  [facility]  [facility] [address] [place], [place] [address] Phone: [phone] FAX: [phone]   CC: Farris Has, MD [address] [place] [address] Phone: [phone]  Fax: [phone]    Return to Endocrinology clinic as below: No future appointments.

## 2020-12-30 ENCOUNTER — Encounter: Payer: Self-pay | Admitting: Gastroenterology

## 2021-03-03 ENCOUNTER — Ambulatory Visit (AMBULATORY_SURGERY_CENTER): Payer: 59 | Admitting: *Deleted

## 2021-03-03 ENCOUNTER — Encounter: Payer: Self-pay | Admitting: Gastroenterology

## 2021-03-03 ENCOUNTER — Other Ambulatory Visit: Payer: Self-pay

## 2021-03-03 VITALS — Ht 66.5 in | Wt 216.0 lb

## 2021-03-03 DIAGNOSIS — Z1211 Encounter for screening for malignant neoplasm of colon: Secondary | ICD-10-CM

## 2021-03-03 MED ORDER — PEG 3350-KCL-NA BICARB-NACL 420 G PO SOLR: 4000.0000 mL | Freq: Once | ORAL | 0 refills | Status: AC

## 2021-03-03 NOTE — Progress Notes (Signed)
Pt verified name, DOB, address and insurance during PV today.  Pt mailed instruction packet of Emmi video, copy of consent form to read and not return, and instructions. . PV completed over the phone.  Pt encouraged to call with questions or issues.  My Chart instructions to pt as well    No egg or soy allergy known to patient  No issues known to pt with past sedation with any surgeries or procedures Patient denies ever being told they had issues or difficulty with intubation  No FH of Malignant Hyperthermia Pt is not on diet pills Pt is not on  home 02  Pt is not on blood thinners  Pt denies issues with constipation  No A fib or A flutter  Pt is fully vaccinated  for Covid    Due to the COVID-19 pandemic we are asking patients to follow certain guidelines in PV and the [facility]   Pt aware of COVID protocols and [facility] guidelines   

## 2021-03-10 ENCOUNTER — Other Ambulatory Visit: Payer: Self-pay

## 2021-03-10 MED ORDER — PEG 3350-KCL-NA BICARB-NACL 420 G PO SOLR: 4000.0000 mL | Freq: Once | ORAL | 0 refills | Status: AC

## 2021-03-16 ENCOUNTER — Encounter: Payer: Self-pay | Admitting: Certified Registered Nurse Anesthetist

## 2021-03-17 ENCOUNTER — Other Ambulatory Visit: Payer: Self-pay

## 2021-03-17 ENCOUNTER — Encounter: Payer: Self-pay | Admitting: Gastroenterology

## 2021-03-17 ENCOUNTER — Ambulatory Visit (AMBULATORY_SURGERY_CENTER): Payer: 59 | Admitting: Gastroenterology

## 2021-03-17 VITALS — BP 100/71 | HR 62 | Temp 97.8°F | Resp 20 | Ht 66.5 in | Wt 208.0 lb

## 2021-03-17 DIAGNOSIS — Z1211 Encounter for screening for malignant neoplasm of colon: Secondary | ICD-10-CM

## 2021-03-17 MED ORDER — SODIUM CHLORIDE 0.9 % IV SOLN: 500.0000 mL | Freq: Once | INTRAVENOUS | Status: DC

## 2021-03-17 NOTE — Progress Notes (Signed)
Report given to PACU, vss 

## 2021-03-17 NOTE — Progress Notes (Signed)
History and Physical:  This patient presents for endoscopic testing for: Encounter Diagnosis  Name Primary?   Special screening for malignant neoplasms, colon Yes    Average risk - first colonoscopy  ROS: Patient denies chest pain or cough   Past Medical History: Past Medical History:  Diagnosis Date   Blood in stool    pt noticed this 3 weks ago from 03-03-2021   Diabetes mellitus without complication (HCC)    Hyperlipidemia    Hypertension      Past Surgical History: Past Surgical History:  Procedure Laterality Date   CHOLECYSTECTOMY N/A 08/21/2012   Procedure: LAPAROSCOPIC CHOLECYSTECTOMY WITH INTRAOPERATIVE CHOLANGIOGRAM;  Surgeon: Wilmon Arms. Corliss Skains, MD;  Location: [facility];  Service: General;  Laterality: N/A;   COLONOSCOPY  09/23/2010   normal   HERNIA REPAIR     UMBILICAL HERNIA REPAIR  08/21/2012   Procedure: HERNIA REPAIR UMBILICAL ADULT;  Surgeon: Wilmon Arms. Tsuei, MD;  Location: [facility];  Service: General;;    Allergies: No Known Allergies  Outpatient Meds: Current Outpatient Medications  Medication Sig Dispense Refill   empagliflozin (JARDIANCE) 25 MG TABS tablet Take 1 tablet (25 mg total) by mouth daily before breakfast. 90 tablet 1   glipiZIDE (GLUCOTROL) 5 MG tablet Take 1 tablet (5 mg total) by mouth 2 (two) times daily before a meal. 180 tablet 1   glucose blood test strip OneTouch Verio test strips  USE TO CHECK EVERY MORNING,ALTERNATING BETWEEN MORNING AND EVENING MEALS 2 3 TIMES A DAY     Lancets (ONETOUCH DELICA PLUS LANCET33G) MISC Apply topically daily.     lisinopril (PRINIVIL,ZESTRIL) 5 MG tablet Take 5 mg by mouth daily.     metFORMIN (GLUCOPHAGE) 500 MG tablet Take 2 tablets (1,000 mg total) by mouth 2 (two) times daily with a meal. 360 tablet 3   pravastatin (PRAVACHOL) 40 MG tablet pravastatin 40 mg tablet  TAKE 1 TABLET BY MOUTH EVERY DAY     clotrimazole-betamethasone (LOTRISONE) cream Apply topically daily as needed.     ibuprofen  (ADVIL) 800 MG tablet Take 800 mg by mouth every 6 (six) hours as needed.     LORazepam (ATIVAN) 1 MG tablet Take 1 mg by mouth daily as needed.     sildenafil (VIAGRA) 50 MG tablet Take 50 mg by mouth daily as needed.     valACYclovir (VALTREX) 500 MG tablet valacyclovir 500 mg tablet     Current Facility-Administered Medications  Medication Dose Route Frequency Provider Last Rate Last Admin   0.9 %  sodium chloride infusion  500 mL Intravenous Once Charlie Pitter III, MD          ___________________________________________________________________ Objective   Exam:  BP 105/68   Pulse 63   Temp 97.8 F (36.6 C) (Temporal)   Resp 10   Ht 5' 6.5" (1.689 m)   Wt 208 lb (94.3 kg)   SpO2 100%   BMI 33.07 kg/m   CV: RRR without murmur, S1/S2 Resp: clear to auscultation bilaterally, normal RR and effort noted GI: soft, no tenderness, with active bowel sounds.   Assessment: Encounter Diagnosis  Name Primary?   Special screening for malignant neoplasms, colon Yes     Plan: Colonoscopy  The benefits and risks of the planned procedure were described in detail with the patient or (when appropriate) their health care proxy.  Risks were outlined as including, but not limited to, bleeding, infection, perforation, adverse medication reaction leading to cardiac or pulmonary decompensation, pancreatitis (if ERCP).  The limitation of incomplete mucosal visualization was also discussed.  No guarantees or warranties were given.    The patient is appropriate for an endoscopic procedure in the ambulatory setting.   - Wilfrid Lund, MD

## 2021-03-17 NOTE — Op Note (Signed)
[facility] Patient Name: Kirk Cox Procedure Date: 03/17/2021 10:50 AM MRN: [id number] Endoscopist: Sherilyn Cooter L. Myrtie Neither , MD Age: 50 Referring MD:  Date of Birth: [birth year] Gender: Male Account #: [id number] Procedure:                Colonoscopy Indications:              Screening for colorectal malignant neoplasm, This                            is the patient's first colonoscopy Medicines:                Monitored Anesthesia Care Procedure:                Pre-Anesthesia Assessment:                           - Prior to the procedure, a History and Physical                            was performed, and patient medications and                            allergies were reviewed. The patient's tolerance of                            previous anesthesia was also reviewed. The risks                            and benefits of the procedure and the sedation                            options and risks were discussed with the patient.                            All questions were answered, and informed consent                            was obtained. Prior Anticoagulants: The patient has                            taken no previous anticoagulant or antiplatelet                            agents. ASA Grade Assessment: II - A patient with                            mild systemic disease. After reviewing the risks                            and benefits, the patient was deemed in                            satisfactory condition to undergo the procedure.  After obtaining informed consent, the colonoscope                            was passed under direct vision. Throughout the                            procedure, the patient's blood pressure, pulse, and                            oxygen saturations were monitored continuously. The                            Olympus CF-HQ190L [id number]) Colonoscope was                            introduced through the anus  and advanced to the the                            cecum, identified by appendiceal orifice and                            ileocecal valve. The colonoscopy was performed                            without difficulty. The patient tolerated the                            procedure well. The quality of the bowel                            preparation was good. The ileocecal valve,                            appendiceal orifice, and rectum were photographed. Scope In: 10:57:45 AM Scope Out: 11:08:50 AM Scope Withdrawal Time: 0 hours 8 minutes 57 seconds  Total Procedure Duration: 0 hours 11 minutes 5 seconds  Findings:                 The perianal and digital rectal examinations were                            normal.                           Internal hemorrhoids were found. The hemorrhoids                            were small.                           The exam was otherwise without abnormality on                            direct and retroflexion views. Complications:            No immediate complications. Estimated Blood Loss:  Estimated blood loss: none. Impression:               - Internal hemorrhoids.                           - The examination was otherwise normal on direct                            and retroflexion views.                           - No specimens collected. Recommendation:           - Patient has a contact number available for                            emergencies. The signs and symptoms of potential                            delayed complications were discussed with the                            patient. Return to normal activities tomorrow.                            Written discharge instructions were provided to the                            patient.                           - Resume previous diet.                           - Continue present medications.                           - Repeat colonoscopy in 10 years for screening                             purposes. Jary Louvier L. Myrtie Neither, MD 03/17/2021 11:13:15 AM This report has been signed electronically.

## 2021-03-17 NOTE — Patient Instructions (Signed)
Discharge instructions given. °Handout on Hemorrhoids. °Resume previous medications. °YOU HAD AN ENDOSCOPIC PROCEDURE TODAY AT THE [facility]:   Refer to the procedure report that was given to you for any specific questions about what was found during the examination.  If the procedure report does not answer your questions, please call your gastroenterologist to clarify.  If you requested that your care partner not be given the details of your procedure findings, then the procedure report has been included in a sealed envelope for you to review at your convenience later. ° °YOU SHOULD EXPECT: Some feelings of bloating in the abdomen. Passage of more gas than usual.  Walking can help get rid of the air that was put into your GI tract during the procedure and reduce the bloating. If you had a lower endoscopy (such as a colonoscopy or flexible sigmoidoscopy) you may notice spotting of blood in your stool or on the toilet paper. If you underwent a bowel prep for your procedure, you may not have a normal bowel movement for a few days. ° °Please Note:  You might notice some irritation and congestion in your nose or some drainage.  This is from the oxygen used during your procedure.  There is no need for concern and it should clear up in a day or so. ° °SYMPTOMS TO REPORT IMMEDIATELY: ° °Following lower endoscopy (colonoscopy or flexible sigmoidoscopy): ° Excessive amounts of blood in the stool ° Significant tenderness or worsening of abdominal pains ° Swelling of the abdomen that is new, acute ° Fever of 100°F or higher ° ° °For urgent or emergent issues, a gastroenterologist can be reached at any hour by calling [phone]. °Do not use MyChart messaging for urgent concerns.  ° ° °DIET:  We do recommend a small meal at first, but then you may proceed to your regular diet.  Drink plenty of fluids but you should avoid alcoholic beverages for 24 hours. ° °ACTIVITY:  You should plan to take it easy for the  rest of today and you should NOT DRIVE or use heavy machinery until tomorrow (because of the sedation medicines used during the test).   ° °FOLLOW UP: °Our staff will call the number listed on your records 48-72 hours following your procedure to check on you and address any questions or concerns that you may have regarding the information given to you following your procedure. If we do not reach you, we will leave a message.  We will attempt to reach you two times.  During this call, we will ask if you have developed any symptoms of COVID 19. If you develop any symptoms (ie: fever, flu-like symptoms, shortness of breath, cough etc.) before then, please call [phone].  If you test positive for Covid 19 in the 2 weeks post procedure, please call and report this information to us.   ° °If any biopsies were taken you will be contacted by phone or by letter within the next 1-3 weeks.  Please call us at [phone] if you have not heard about the biopsies in 3 weeks.  ° ° °SIGNATURES/CONFIDENTIALITY: °You and/or your care partner have signed paperwork which will be entered into your electronic medical record.  These signatures attest to the fact that that the information above on your After Visit Summary has been reviewed and is understood.  Full responsibility of the confidentiality of this discharge information lies with you and/or your care-partner.  °

## 2021-03-17 NOTE — Progress Notes (Signed)
VS completed by DT.  Pt's states no medical or surgical changes since previsit or office visit.  

## 2021-03-19 ENCOUNTER — Telehealth: Payer: Self-pay | Admitting: *Deleted

## 2021-03-19 NOTE — Telephone Encounter (Signed)
Attempted to call patient for their post-procedure follow-up call. No answer. Left voicemail.   

## 2021-03-19 NOTE — Telephone Encounter (Signed)
  Follow up Call-  Call back number 03/17/2021  Post procedure Call Back phone  # [phone]  Permission to leave phone message Yes  Some recent data might be hidden     Patient questions:  Do you have a fever, pain , or abdominal swelling? No. Pain Score  0 *  Have you tolerated food without any problems? Yes.    Have you been able to return to your normal activities? Yes.    Do you have any questions about your discharge instructions: Diet   No. Medications  No. Follow up visit  No.  Do you have questions or concerns about your Care? No.  Actions: * If pain score is 4 or above: No action needed, pain <4.   Have you developed a fever since your procedure? no  2.   Have you had an respiratory symptoms (SOB or cough) since your procedure? no  3.   Have you tested positive for COVID 19 since your procedure no  4.   Have you had any family members/close contacts diagnosed with the COVID 19 since your procedure?  no   If yes to any of these questions please route to Laverna Peace, RN and Karlton Lemon, RN

## 2021-03-31 ENCOUNTER — Ambulatory Visit (INDEPENDENT_AMBULATORY_CARE_PROVIDER_SITE_OTHER): Payer: 59 | Admitting: Internal Medicine

## 2021-03-31 ENCOUNTER — Encounter: Payer: Self-pay | Admitting: Internal Medicine

## 2021-03-31 ENCOUNTER — Other Ambulatory Visit: Payer: Self-pay

## 2021-03-31 VITALS — BP 122/86 | HR 74 | Ht 66.5 in | Wt 212.0 lb

## 2021-03-31 DIAGNOSIS — E785 Hyperlipidemia, unspecified: Secondary | ICD-10-CM

## 2021-03-31 DIAGNOSIS — E1165 Type 2 diabetes mellitus with hyperglycemia: Secondary | ICD-10-CM | POA: Diagnosis not present

## 2021-03-31 LAB — POCT GLYCOSYLATED HEMOGLOBIN (HGB A1C): Hemoglobin A1C: 7.6 % — AB (ref 4.0–5.6)

## 2021-03-31 LAB — GLUCOSE, POCT (MANUAL RESULT ENTRY): POC Glucose: 295 mg/dl — AB (ref 70–99)

## 2021-03-31 NOTE — Progress Notes (Signed)
Name: Kirk Cox  MRN/ DOB: [id number], 1971-03-21   Age/ Sex: 50 y.o., male    PCP: Farris Has, MD   Reason for Endocrinology Evaluation: Type 2 Diabetes Mellitus     Date of Initial Endocrinology Visit: 12/28/2020    PATIENT IDENTIFIER: Kirk Cox is a 50 y.o. male with a past medical history of T2DM, HTN and Dyslipidemia. The patient presented for initial endocrinology clinic visit on 12/28/2020 for consultative assistance with his diabetes management.    HPI: Mr. Adduci was    Diagnosed with DM 2019 Hemoglobin A1c has ranged from 6.7% in 02/2020, peaking at 12.2% in 2022.  On his initial visit to our clinic his A1c was 11.3%, he was on glipizide XL, metformin, and Jardiance  SUBJECTIVE:   During the last visit (12/28/2020): A1c 11.3% we increase Jardiance, switch glipizide XL to regular glipizide and continued metformin  Today (03/31/2021: Mr. Flemister is here for follow-up on diabetes management.he checks his blood sugars 1 times daily.  He did not bring his meter today. The patient has not had hypoglycemic episodes since the last clinic visit.  He has stopped using sugar-sweetened beverages , he has been walking as well  He has been skipping on evening Metformin     HOME DIABETES REGIMEN: Metformin 500 mg 2 tabs BID Glipizide 5 mg BID Jardiance 25 mg daily      Statin: yes ACE-I/ARB: yes Prior Diabetic Education: yes    METER DOWNLOAD SUMMARY: Did not bring  This am 315 mg/dL ( ate ice cream 3-4 bars)   DIABETIC COMPLICATIONS: Microvascular complications:    Denies: CKD, retinopathy, neuropathy  Last eye exam: Completed 04/2020  Macrovascular complications:   Denies: CAD, PVD, CVA   PAST HISTORY: Past Medical History:  Past Medical History:  Diagnosis Date   Blood in stool    pt noticed this 3 weks ago from 03-03-2021   Diabetes mellitus without complication (HCC)    Hyperlipidemia    Hypertension    Past Surgical History:  Past  Surgical History:  Procedure Laterality Date   CHOLECYSTECTOMY N/A 08/21/2012   Procedure: LAPAROSCOPIC CHOLECYSTECTOMY WITH INTRAOPERATIVE CHOLANGIOGRAM;  Surgeon: Wilmon Arms. Corliss Skains, MD;  Location: [facility];  Service: General;  Laterality: N/A;   COLONOSCOPY  09/23/2010   normal   HERNIA REPAIR     UMBILICAL HERNIA REPAIR  08/21/2012   Procedure: HERNIA REPAIR UMBILICAL ADULT;  Surgeon: Wilmon Arms. Corliss Skains, MD;  Location: [facility];  Service: General;;    Social History:  reports that he quit smoking about 2 years ago. His smoking use included cigarettes. He has never used smokeless tobacco. He reports current alcohol use. He reports that he does not use drugs. Family History:  Family History  Problem Relation Age of Onset   Diabetes Mother    Diabetes Father    Colon cancer Neg Hx    Colon polyps Neg Hx    Esophageal cancer Neg Hx    Rectal cancer Neg Hx    Stomach cancer Neg Hx      HOME MEDICATIONS: Allergies as of 03/31/2021   No Known Allergies      Medication List        Accurate as of March 31, 2021 11:16 AM. If you have any questions, ask your nurse or doctor.          clotrimazole-betamethasone cream Commonly known as: LOTRISONE Apply topically daily as needed.   empagliflozin 25 MG Tabs tablet Commonly known as: [place]  Take 1 tablet (25 mg total) by mouth daily before breakfast.   glipiZIDE 5 MG tablet Commonly known as: GLUCOTROL Take 1 tablet (5 mg total) by mouth 2 (two) times daily before a meal.   glucose blood test strip OneTouch Verio test strips  USE TO CHECK EVERY MORNING,ALTERNATING BETWEEN MORNING AND EVENING MEALS 2 3 TIMES A DAY   ibuprofen 800 MG tablet Commonly known as: ADVIL Take 800 mg by mouth every 6 (six) hours as needed.   lisinopril 5 MG tablet Commonly known as: ZESTRIL Take 5 mg by mouth daily.   LORazepam 1 MG tablet Commonly known as: ATIVAN Take 1 mg by mouth daily as needed.   metFORMIN 500 MG tablet Commonly  known as: GLUCOPHAGE Take 2 tablets (1,000 mg total) by mouth 2 (two) times daily with a meal.   OneTouch Delica Plus Lancet33G Misc Apply topically daily.   pravastatin 40 MG tablet Commonly known as: PRAVACHOL pravastatin 40 mg tablet  TAKE 1 TABLET BY MOUTH EVERY DAY   sildenafil 50 MG tablet Commonly known as: VIAGRA Take 50 mg by mouth daily as needed.   valACYclovir 500 MG tablet Commonly known as: VALTREX valacyclovir 500 mg tablet         ALLERGIES: No Known Allergies      OBJECTIVE:   VITAL SIGNS: BP 122/86 (BP Location: Left Arm, Patient Position: Sitting, Cuff Size: Large)   Pulse 74   Ht 5' 6.5" (1.689 m)   Wt 212 lb (96.2 kg)   SpO2 98%   BMI 33.71 kg/m    PHYSICAL EXAM:  General: Pt appears well and is in NAD  Lungs: Clear with good BS bilat with no rales, rhonchi, or wheezes  Heart: RRR   Abdomen: Normoactive bowel sounds, soft, nontender  Extremities:  Lower extremities - No pretibial edema. No lesions.  Neuro: MS is good with appropriate affect, pt is alert and Ox3    DM foot exam: 03/31/2021  The skin of the feet is intact without sores or ulcerations. The pedal pulses are 2+ on right and 2+ on left. The sensation is intact to a screening 5.07, 10 gram monofilament bilaterally    DATA REVIEWED:  12/16/2020 Gluc 319 BUN/CR 14/1.04 GFR 86 Ca 10.3 A1c 12.2% Tg 101 HDL 45 LDL 149 MA/Cr 109.6     ASSESSMENT / PLAN / RECOMMENDATIONS:   1) Type 2 Diabetes Mellitus, Poorly controlled, With out complications - Most recent A1c of 7.6 %. Goal A1c < 7.0 %.     -I have praised the patient on improved glycemic control, his A1c down from 11.3% -I have encouraged him to continue to avoid sugar sweetened beverages, we discussed importance of low-carb diet and I have encouraged him to continue with walking -His BG this morning was 315 mg/DL, patient admits to eating multiple ice cream bars, will use this example to educate the patient  about the impact of high carb diet on his health -No changes today -I have also encouraged him to use reminders to use evening dose of metformin  MEDICATIONS: Continue metformin 500 mg, 2 tabs twice daily Continue Jardiance to 25 mg, 1 tablet in the morning  Continue glipizide 5 mg 1 tablet before Breakfast and 1 tablet before supper  EDUCATION / INSTRUCTIONS: BG monitoring instructions: Patient is instructed to check his blood sugars 1 times a day, fasting. Call  Endocrinology clinic if: BG persistently < 70  I reviewed the Rule of 15 for the treatment of hypoglycemia  in detail with the patient. Literature supplied.   2) Diabetic complications:  Eye: Does not have known diabetic retinopathy.  Neuro/ Feet: Does not have known diabetic peripheral neuropathy. Renal: Patient does not have known baseline CKD. He is not on an ACEI/ARB at present.   3) Dyslipidemia:  -His LDL above goal at 149 mg/DL, patient on pravastatin, he has been working on lifestyle changes -We will consider switching pravastatin if LDL continues to be above goal    Medication Continue pravastatin 40 mg daily    Follow-up in 4 months   Signed electronically by: Lyndle Herrlich, MD  [facility] Endocrinology  [facility] [address] [place], [place] [address] Phone: [phone] FAX: [phone]   CC: Farris Has, MD [address] [place] [address] Phone: [phone]  Fax: [phone]    Return to Endocrinology clinic as below: No future appointments.

## 2021-03-31 NOTE — Patient Instructions (Signed)
-   Keep Up the Good Work ! - Continue Metformin 500 mg , 2 Tablets with Breakfast and Two tablet tablets with Supper  - Continue  Jardiance 25 mg, 1 tablet in the morning  - Continue Glipizide 5 mg 1 tablet before Breakfast and 1 tablet before supper      HOW TO TREAT LOW BLOOD SUGARS (Blood sugar LESS THAN 70 MG/DL) Please follow the RULE OF 15 for the treatment of hypoglycemia treatment (when your (blood sugars are less than 70 mg/dL)   STEP 1: Take 15 grams of carbohydrates when your blood sugar is low, which includes:  3-4 GLUCOSE TABS  OR 3-4 OZ OF JUICE OR REGULAR SODA OR ONE TUBE OF GLUCOSE GEL    STEP 2: RECHECK blood sugar in 15 MINUTES STEP 3: If your blood sugar is still low at the 15 minute recheck --> then, go back to STEP 1 and treat AGAIN with another 15 grams of carbohydrates.

## 2021-07-28 ENCOUNTER — Other Ambulatory Visit: Payer: Self-pay | Admitting: Internal Medicine

## 2021-07-28 ENCOUNTER — Other Ambulatory Visit: Payer: Self-pay

## 2021-07-28 ENCOUNTER — Ambulatory Visit: Payer: 59 | Admitting: Internal Medicine

## 2021-07-28 ENCOUNTER — Encounter: Payer: Self-pay | Admitting: Internal Medicine

## 2021-07-28 ENCOUNTER — Other Ambulatory Visit (HOSPITAL_COMMUNITY): Payer: Self-pay

## 2021-07-28 VITALS — BP 140/82 | HR 88 | Ht 66.5 in | Wt 210.0 lb

## 2021-07-28 DIAGNOSIS — E1165 Type 2 diabetes mellitus with hyperglycemia: Secondary | ICD-10-CM

## 2021-07-28 DIAGNOSIS — E785 Hyperlipidemia, unspecified: Secondary | ICD-10-CM | POA: Diagnosis not present

## 2021-07-28 LAB — BASIC METABOLIC PANEL
BUN: 12 mg/dL (ref 6–23)
CO2: 24 mEq/L (ref 19–32)
Calcium: 10.1 mg/dL (ref 8.4–10.5)
Chloride: 104 mEq/L (ref 96–112)
Creatinine, Ser: 1.09 mg/dL (ref 0.40–1.50)
GFR: 78.07 mL/min (ref >60.00)
Glucose, Bld: 148 mg/dL — ABNORMAL HIGH (ref 70–99)
Potassium: 4.3 mEq/L (ref 3.5–5.1)
Sodium: 136 mEq/L (ref 135–145)

## 2021-07-28 LAB — LIPID PANEL
Cholesterol: 155 mg/dL (ref 0–200)
HDL: 44.9 mg/dL (ref >39.00)
LDL Cholesterol: 94 mg/dL (ref 0–99)
NonHDL: 109.74
Total CHOL/HDL Ratio: 3
Triglycerides: 78 mg/dL (ref 0.0–149.0)
VLDL: 15.6 mg/dL (ref 0.0–40.0)

## 2021-07-28 LAB — POCT GLYCOSYLATED HEMOGLOBIN (HGB A1C): Hemoglobin A1C: 8.5 % — AB (ref 4.0–5.6)

## 2021-07-28 LAB — MICROALBUMIN / CREATININE URINE RATIO
Creatinine,U: 43.8 mg/dL
Microalb Creat Ratio: 1.7 mg/g (ref 0.0–30.0)
Microalb, Ur: 0.7 mg/dL (ref 0.0–1.9)

## 2021-07-28 MED ORDER — GLIPIZIDE ER 5 MG PO TB24
5.0000 mg | ORAL_TABLET | Freq: Every day | ORAL | 2 refills | Status: DC
Start: 2021-07-28 — End: 2022-02-17

## 2021-07-28 MED ORDER — ATORVASTATIN CALCIUM 20 MG PO TABS: 20.0000 mg | ORAL_TABLET | Freq: Every day | ORAL | 3 refills | Status: DC

## 2021-07-28 MED ORDER — EMPAGLIFLOZIN 25 MG PO TABS: 25.0000 mg | ORAL_TABLET | Freq: Every day | ORAL | 3 refills | Status: DC

## 2021-07-28 MED ORDER — METFORMIN HCL ER (OSM) 500 MG PO TB24
2000.0000 mg | ORAL_TABLET | Freq: Every day | ORAL | 3 refills | Status: DC
Start: 2021-07-28 — End: 2022-02-17

## 2021-07-28 NOTE — Patient Instructions (Signed)
-   Take Glipizide 5 mg XL, 1 tablet before Breakfast  ?- Take  Metformin 500 mg XR , 4 Tablets before  Breakfast  ?- Continue  Jardiance 25 mg, 1 tablet in the morning  ? ? ? ? ? ?HOW TO TREAT LOW BLOOD SUGARS (Blood sugar LESS THAN 70 MG/DL) ?Please follow the RULE OF 15 for the treatment of hypoglycemia treatment (when your (blood sugars are less than 70 mg/dL)  ? ?STEP 1: Take 15 grams of carbohydrates when your blood sugar is low, which includes:  ?3-4 GLUCOSE TABS  OR ?3-4 OZ OF JUICE OR REGULAR SODA OR ?ONE TUBE OF GLUCOSE GEL   ? ?STEP 2: RECHECK blood sugar in 15 MINUTES ?STEP 3: If your blood sugar is still low at the 15 minute recheck --> then, go back to STEP 1 and treat AGAIN with another 15 grams of carbohydrates. ? ?

## 2021-07-28 NOTE — Progress Notes (Signed)
Name: Kirk Cox  MRN/ DOB: [id number], 1970-09-28   Age/ Sex: 51 y.o., male    PCP: Farris Has, MD   Reason for Endocrinology Evaluation: Type 2 Diabetes Mellitus     Date of Initial Endocrinology Visit: 12/28/2020    PATIENT IDENTIFIER: [name] is a 51 y.o. male with a past medical history of T2DM, HTN and Dyslipidemia. The patient presented for initial endocrinology clinic visit on 12/28/2020 for consultative assistance with his diabetes management.    HPI: [name] was    Diagnosed with DM 2019 Hemoglobin A1c has ranged from 6.7% in 02/2020, peaking at 12.2% in 2022.  On his initial visit to our clinic his A1c was 11.3%, he was on glipizide XL, metformin, and Jardiance  SUBJECTIVE:   During the last visit (03/31/2021): A1c 7.6 % we continued Jardiance,glipizide and continued metformin  Today (03/31/2021: [name] is here for follow-up on diabetes management.he checks his blood sugars 1 times daily.  He did not bring his meter today. The patient has not had hypoglycemic episodes since the last clinic visit.  He forgets to take his evening glycemic agents  Has noted frequency  Denies nausea, vomiting  Has noted nocturia    HOME DIABETES REGIMEN: Metformin 500 mg 2 tabs BID Glipizide 5 mg BID Jardiance 25 mg daily      Statin: yes ACE-I/ARB: yes Prior Diabetic Education: yes    METER DOWNLOAD SUMMARY: 2/16-3 /05/2021 Fingerstick Blood Glucose Tests = 10  Overall Mean FS Glucose = 200 Standard Deviation = 29  BG Ranges: Low = 155 High = 252   Hypoglycemic Events/30 Days: BG < 50 = 0 Episodes of symptomatic severe hypoglycemia = 0   DIABETIC COMPLICATIONS: Microvascular complications:    Denies: CKD, retinopathy, neuropathy  Last eye exam: Completed 05/2021  Macrovascular complications:   Denies: CAD, PVD, CVA   PAST HISTORY: Past Medical History:  Past Medical History:  Diagnosis Date   Blood in stool    pt noticed this  3 weks ago from 03-03-2021   Diabetes mellitus without complication (HCC)    Hyperlipidemia    Hypertension    Past Surgical History:  Past Surgical History:  Procedure Laterality Date   CHOLECYSTECTOMY N/A 08/21/2012   Procedure: LAPAROSCOPIC CHOLECYSTECTOMY WITH INTRAOPERATIVE CHOLANGIOGRAM;  Surgeon: Wilmon Arms. Corliss Skains, MD;  Location: [facility];  Service: General;  Laterality: N/A;   COLONOSCOPY  09/23/2010   normal   HERNIA REPAIR     UMBILICAL HERNIA REPAIR  08/21/2012   Procedure: HERNIA REPAIR UMBILICAL ADULT;  Surgeon: Wilmon Arms. Corliss Skains, MD;  Location: [facility];  Service: General;;    Social History:  reports that he quit smoking about 3 years ago. His smoking use included cigarettes. He has never used smokeless tobacco. He reports current alcohol use. He reports that he does not use drugs. Family History:  Family History  Problem Relation Age of Onset   Diabetes Mother    Diabetes Father    Colon cancer Neg Hx    Colon polyps Neg Hx    Esophageal cancer Neg Hx    Rectal cancer Neg Hx    Stomach cancer Neg Hx      HOME MEDICATIONS: Allergies as of 07/28/2021   No Known Allergies      Medication List        Accurate as of July 28, 2021  3:56 PM. If you have any questions, ask your nurse or doctor.  STOP taking these medications    glipiZIDE 5 MG tablet Commonly known as: GLUCOTROL Replaced by: glipiZIDE 5 MG 24 hr tablet Stopped by: Scarlette Shorts, MD   metFORMIN 500 MG tablet Commonly known as: GLUCOPHAGE Replaced by: metformin 500 MG (OSM) 24 hr tablet Stopped by: Scarlette Shorts, MD   pravastatin 40 MG tablet Commonly known as: PRAVACHOL Stopped by: Scarlette Shorts, MD       TAKE these medications    atorvastatin 20 MG tablet Commonly known as: LIPITOR Take 1 tablet (20 mg total) by mouth daily. Started by: Scarlette Shorts, MD   clotrimazole-betamethasone cream Commonly known as: LOTRISONE Apply topically daily as  needed.   empagliflozin 25 MG Tabs tablet Commonly known as: Jardiance Take 1 tablet (25 mg total) by mouth daily before breakfast.   glipiZIDE 5 MG 24 hr tablet Commonly known as: GLUCOTROL XL Take 1 tablet (5 mg total) by mouth daily with breakfast. Replaces: glipiZIDE 5 MG tablet Started by: Scarlette Shorts, MD   glucose blood test strip OneTouch Verio test strips  USE TO CHECK EVERY MORNING,ALTERNATING BETWEEN MORNING AND EVENING MEALS 2 3 TIMES A DAY   ibuprofen 800 MG tablet Commonly known as: ADVIL Take 800 mg by mouth every 6 (six) hours as needed.   lisinopril 5 MG tablet Commonly known as: ZESTRIL Take 5 mg by mouth daily.   LORazepam 1 MG tablet Commonly known as: ATIVAN Take 1 mg by mouth daily as needed.   metformin 500 MG (OSM) 24 hr tablet Commonly known as: FORTAMET Take 4 tablets (2,000 mg total) by mouth daily with breakfast. Replaces: metFORMIN 500 MG tablet Started by: Scarlette Shorts, MD   OneTouch Delica Plus [id number] Misc Apply topically daily.   sildenafil 50 MG tablet Commonly known as: VIAGRA Take 50 mg by mouth daily as needed.   valACYclovir 500 MG tablet Commonly known as: VALTREX valacyclovir 500 mg tablet         ALLERGIES: No Known Allergies      OBJECTIVE:   VITAL SIGNS: BP 140/82 (BP Location: Left Arm, Patient Position: Sitting, Cuff Size: Small)    Pulse 88    Ht 5' 6.5" (1.689 m)    Wt 210 lb (95.3 kg)    SpO2 96%    BMI 33.39 kg/m    PHYSICAL EXAM:  General: Pt appears well and is in NAD  Lungs: Clear with good BS bilat with no rales, rhonchi, or wheezes  Heart: RRR   Abdomen: Normoactive bowel sounds, soft, nontender  Extremities:  Lower extremities - No pretibial edema. No lesions.  Neuro: MS is good with appropriate affect, pt is alert and Ox3    DM foot exam: 03/31/2021  The skin of the feet is intact without sores or ulcerations. The pedal pulses are 2+ on right and 2+ on left. The  sensation is intact to a screening 5.07, 10 gram monofilament bilaterally    DATA REVIEWED:    Latest Reference Range & Units 07/28/21 11:43  Sodium 135 - 145 mEq/L 136  Potassium 3.5 - 5.1 mEq/L 4.3  Chloride 96 - 112 mEq/L 104  CO2 19 - 32 mEq/L 24  Glucose 70 - 99 mg/dL 945 (H)  BUN 6 - 23 mg/dL 12  Creatinine 8.59 - 2.92 mg/dL 4.46  Calcium 8.4 - 28.6 mg/dL 38.1  GFR >77.11 mL/min 78.07  Total CHOL/HDL Ratio  3  Cholesterol 0 - 200 mg/dL 657  HDL Cholesterol >90.38 mg/dL  44.90  LDL (calc) 0 - 99 mg/dL 94  MICROALB/CREAT RATIO 0.0 - 30.0 mg/g 1.7  NonHDL  109.74  Triglycerides 0.0 - 149.0 mg/dL 62.7  VLDL 0.0 - 03.5 mg/dL 00.9    Latest Reference Range & Units 07/28/21 11:43  Creatinine,U mg/dL 38.1  Microalb, Ur 0.0 - 1.9 mg/dL 0.7  MICROALB/CREAT RATIO 0.0 - 30.0 mg/g 1.7       12/16/2020 Gluc 319 BUN/CR 14/1.04 GFR 86 Ca 10.3 A1c 12.2% Tg 101 HDL 45 LDL 149 MA/Cr 109.6     ASSESSMENT / PLAN / RECOMMENDATIONS:   1) Type 2 Diabetes Mellitus, Poorly controlled, Without complications - Most recent A1c of 8.5 %. Goal A1c < 7.0 %.     - Pt with worsening glycemic control , he continues to forget to take evening doses of glycemic agents  - I am going to change it to extended release, that way he can take it once a day in the morning -I have cautioned him against hypoglycemia with glipizide and the importance of taking it before first meal of the day -BMP, MA/CR normal  MEDICATIONS: Take metformin 500 mg XR , 4 tablets daily Continue Jardiance 25 mg, 1 tablet in the morning  Take glipizide 5 mg XL, 1 tablet before Breakfast   EDUCATION / INSTRUCTIONS: BG monitoring instructions: Patient is instructed to check his blood sugars 1 times a day, fasting. Call [facility] if: BG persistently < 70  I reviewed the Rule of 15 for the treatment of hypoglycemia in detail with the patient. Literature supplied.   2) Diabetic complications:  Eye:  Does not have known diabetic retinopathy.  Neuro/ Feet: Does not have known diabetic peripheral neuropathy. Renal: Patient does not have known baseline CKD. He is not on an ACEI/ARB at present.   3) Dyslipidemia:  -His LDL and TG has trended down -I will switch pravastatin 40 mg to atorvastatin 20 mg   Medication Stop pravastatin 40 mg daily Start atorvastatin 20 mg daily    Follow-up in 4 months   Signed electronically by: Lyndle Herrlich, MD  [facility]ology  [facility] [address] [place], [place] [address] Phone: [phone] FAX: [phone]   CC: Farris Has, MD [address] [place] [address] Phone: [phone]  Fax: [phone]    Return to Endocrinology clinic as below: Future Appointments  Date Time Provider Department Center  12/08/2021 11:30 AM Mitcheal Sweetin, Konrad Dolores, MD [id number] None

## 2021-12-08 ENCOUNTER — Ambulatory Visit: Payer: 59 | Admitting: Internal Medicine

## 2022-02-17 ENCOUNTER — Telehealth: Payer: Self-pay

## 2022-02-17 ENCOUNTER — Encounter: Payer: Self-pay | Admitting: Internal Medicine

## 2022-02-17 ENCOUNTER — Ambulatory Visit: Payer: 59 | Admitting: Internal Medicine

## 2022-02-17 ENCOUNTER — Other Ambulatory Visit: Payer: Self-pay | Admitting: Internal Medicine

## 2022-02-17 VITALS — BP 120/78 | HR 72 | Ht 66.5 in | Wt 211.4 lb

## 2022-02-17 DIAGNOSIS — E1165 Type 2 diabetes mellitus with hyperglycemia: Secondary | ICD-10-CM

## 2022-02-17 DIAGNOSIS — E785 Hyperlipidemia, unspecified: Secondary | ICD-10-CM

## 2022-02-17 LAB — POCT GLYCOSYLATED HEMOGLOBIN (HGB A1C): Hemoglobin A1C: 11.3 % — AB (ref 4.0–5.6)

## 2022-02-17 LAB — POCT GLUCOSE (DEVICE FOR HOME USE): POC Glucose: 346 mg/dl — AB (ref 70–99)

## 2022-02-17 MED ORDER — EMPAGLIFLOZIN 25 MG PO TABS: 25.0000 mg | ORAL_TABLET | Freq: Every day | ORAL | 3 refills | Status: DC

## 2022-02-17 MED ORDER — METFORMIN HCL ER (OSM) 500 MG PO TB24: 2000.0000 mg | ORAL_TABLET | Freq: Every day | ORAL | 3 refills | Status: DC

## 2022-02-17 MED ORDER — GLIPIZIDE ER 10 MG PO TB24
10.0000 mg | ORAL_TABLET | Freq: Every day | ORAL | 5 refills | Status: AC
Start: 2022-02-17 — End: ?

## 2022-02-17 NOTE — Patient Instructions (Signed)
-   Increase Glipizide 10 mg XL, 1 tablet before Breakfast  - Take  Metformin 500 mg XR , 4 Tablets before  Breakfast  - Continue  Jardiance 25 mg, 1 tablet in the morning       HOW TO TREAT LOW BLOOD SUGARS (Blood sugar LESS THAN 70 MG/DL) Please follow the RULE OF 15 for the treatment of hypoglycemia treatment (when your (blood sugars are less than 70 mg/dL)   STEP 1: Take 15 grams of carbohydrates when your blood sugar is low, which includes:  3-4 GLUCOSE TABS  OR 3-4 OZ OF JUICE OR REGULAR SODA OR ONE TUBE OF GLUCOSE GEL    STEP 2: RECHECK blood sugar in 15 MINUTES STEP 3: If your blood sugar is still low at the 15 minute recheck --> then, go back to STEP 1 and treat AGAIN with another 15 grams of carbohydrates.

## 2022-02-17 NOTE — Telephone Encounter (Signed)
PA received in Covermymeds from [employer] for Metformin XR OSM 500mg  tablets.   PA has been submitted.  Awaiting determination.   Key: [id number]

## 2022-02-17 NOTE — Progress Notes (Signed)
Name: Kirk Cox  MRN/ DOB: [id number], June 03, 1970   Age/ Sex: 51 y.o., male    PCP: London Pepper, MD   Reason for Endocrinology Evaluation: Type 2 Diabetes Mellitus     Date of Initial Endocrinology Visit: 12/28/2020    PATIENT IDENTIFIER: Kirk Cox is a 51 y.o. male with a past medical history of T2DM, HTN and Dyslipidemia. The patient presented for initial endocrinology clinic visit on 12/28/2020 for consultative assistance with his diabetes management.    HPI: [name] was    Diagnosed with DM 2019 Hemoglobin A1c has ranged from 6.7% in 02/2020, peaking at 12.2% in 2022.  On his initial visit to our clinic his A1c was 11.3%, he was on glipizide XL, metformin, and Jardiance    We switched pravastatin to atorvastatin 07/2021 ( LDL 94 at the time )   Switch Glipizide to XL because he would forget the 2nd dose   SUBJECTIVE:   During the last visit (07/28/2021): A1c 7.6 %   Today (03/31/2021: [name] is here for follow-up on diabetes management.he checks his blood sugars 1 times daily.  He did not bring his meter today. The patient has not had hypoglycemic episodes since the last clinic visit.    He was having issues with Metformin causing loose stools but this is improving  Today he drank a Research scientist (life sciences) before his visit today    HOME DIABETES REGIMEN: Metformin 500 mg 2 tabs BID Glipizide 5 mg  XL daily  Jardiance 25 mg daily  Atorvastatin 20 mg daily   Statin: yes ACE-I/ARB: yes Prior Diabetic Education: yes    METER DOWNLOAD SUMMARY: did not bring    DIABETIC COMPLICATIONS: Microvascular complications:    Denies: CKD, retinopathy, neuropathy  Last eye exam: Completed 05/2021  Macrovascular complications:   Denies: CAD, PVD, CVA   PAST HISTORY: Past Medical History:  Past Medical History:  Diagnosis Date   Blood in stool    pt noticed this 3 weks ago from 03-03-2021   Diabetes mellitus without complication ([place])    Hyperlipidemia     Hypertension    Past Surgical History:  Past Surgical History:  Procedure Laterality Date   CHOLECYSTECTOMY N/A 08/21/2012   Procedure: LAPAROSCOPIC CHOLECYSTECTOMY WITH INTRAOPERATIVE CHOLANGIOGRAM;  Surgeon: Imogene Burn. Georgette Dover, MD;  Location: [facility];  Service: General;  Laterality: N/A;   COLONOSCOPY  09/23/2010   normal   HERNIA REPAIR     UMBILICAL HERNIA REPAIR  08/21/2012   Procedure: HERNIA REPAIR UMBILICAL ADULT;  Surgeon: Imogene Burn. Georgette Dover, MD;  Location: [facility];  Service: General;;    Social History:  reports that he quit smoking about 3 years ago. His smoking use included cigarettes. He has never used smokeless tobacco. He reports current alcohol use. He reports that he does not use drugs. Family History:  Family History  Problem Relation Age of Onset   Diabetes Mother    Diabetes Father    Colon cancer Neg Hx    Colon polyps Neg Hx    Esophageal cancer Neg Hx    Rectal cancer Neg Hx    Stomach cancer Neg Hx      HOME MEDICATIONS: Allergies as of 02/17/2022   No Known Allergies      Medication List        Accurate as of February 17, 2022 12:37 PM. If you have any questions, ask your nurse or doctor.          STOP taking these medications  atorvastatin 20 MG tablet Commonly known as: LIPITOR Stopped by: Dorita Sciara, MD       TAKE these medications    clotrimazole-betamethasone cream Commonly known as: LOTRISONE Apply topically daily as needed.   empagliflozin 25 MG Tabs tablet Commonly known as: Jardiance Take 1 tablet (25 mg total) by mouth daily before breakfast.   glipiZIDE 5 MG 24 hr tablet Commonly known as: GLUCOTROL XL Take 1 tablet (5 mg total) by mouth daily with breakfast.   glucose blood test strip OneTouch Verio test strips  USE TO CHECK EVERY MORNING,ALTERNATING BETWEEN MORNING AND EVENING MEALS 2 3 TIMES A DAY   ibuprofen 800 MG tablet Commonly known as: ADVIL Take 800 mg by mouth every 6 (six) hours as needed.    lisinopril 5 MG tablet Commonly known as: ZESTRIL Take 5 mg by mouth daily.   LORazepam 1 MG tablet Commonly known as: ATIVAN Take 1 mg by mouth daily as needed.   metformin 500 MG (OSM) 24 hr tablet Commonly known as: FORTAMET Take 4 tablets (2,000 mg total) by mouth daily with breakfast.   OneTouch Delica Plus [id number] Misc Apply topically daily.   pravastatin 40 MG tablet Commonly known as: PRAVACHOL Take 40 mg by mouth in the morning and at bedtime.   sildenafil 50 MG tablet Commonly known as: VIAGRA Take 50 mg by mouth daily as needed.   valACYclovir 500 MG tablet Commonly known as: VALTREX valacyclovir 500 mg tablet         ALLERGIES: No Known Allergies      OBJECTIVE:   VITAL SIGNS: BP 120/78 (BP Location: Left Arm, Patient Position: Sitting, Cuff Size: Large)   Pulse 72   Ht 5' 6.5" (1.689 m)   Wt 211 lb 6.4 oz (95.9 kg)   SpO2 98%   BMI 33.61 kg/m    PHYSICAL EXAM:  General: Pt appears well and is in NAD  Lungs: Clear with good BS bilat with no rales, rhonchi, or wheezes  Heart: RRR   Abdomen: Normoactive bowel sounds, soft, nontender  Extremities:  Lower extremities - No pretibial edema. No lesions.  Neuro: MS is good with appropriate affect, pt is alert and Ox3    DM foot exam: 03/31/2021  The skin of the feet is intact without sores or ulcerations. The pedal pulses are 2+ on right and 2+ on left. The sensation is intact to a screening 5.07, 10 gram monofilament bilaterally    DATA REVIEWED:    Latest Reference Range & Units 07/28/21 11:43  Sodium 135 - 145 mEq/L 136  Potassium 3.5 - 5.1 mEq/L 4.3  Chloride 96 - 112 mEq/L 104  CO2 19 - 32 mEq/L 24  Glucose 70 - 99 mg/dL 148 (H)  BUN 6 - 23 mg/dL 12  Creatinine 0.40 - 1.50 mg/dL 1.09  Calcium 8.4 - 10.5 mg/dL 10.1  GFR >60.00 mL/min 78.07  Total CHOL/HDL Ratio  3  Cholesterol 0 - 200 mg/dL 155  HDL Cholesterol >39.00 mg/dL 44.90  LDL (calc) 0 - 99 mg/dL 94  MICROALB/CREAT  RATIO 0.0 - 30.0 mg/g 1.7  NonHDL  109.74  Triglycerides 0.0 - 149.0 mg/dL 78.0  VLDL 0.0 - 40.0 mg/dL 15.6    Latest Reference Range & Units 07/28/21 11:43  Creatinine,U mg/dL 43.8  Microalb, Ur 0.0 - 1.9 mg/dL 0.7  MICROALB/CREAT RATIO 0.0 - 30.0 mg/g 1.7      ASSESSMENT / PLAN / RECOMMENDATIONS:   1) Type 2 Diabetes Mellitus, Poorly controlled, Without  complications - Most recent A1c of 11.3 %. Goal A1c < 7.0 %.    -Patient with worsening glycemic control, patient admits to dietary indiscretions -I suspect there is medication nonadherence as well -I will increase glipizide as below, if this remains elevated we will consider starting him on insulin   MEDICATIONS: Take metformin 500 mg XR , 4 tablets daily Continue Jardiance 25 mg, 1 tablet in the morning  Increase glipizide 10 mg XL, 1 tablet before Breakfast   EDUCATION / INSTRUCTIONS: BG monitoring instructions: Patient is instructed to check his blood sugars 1 times a day, fasting. Call [place] [facility] clinic if: BG persistently < 70  I reviewed the Rule of 15 for the treatment of hypoglycemia in detail with the patient. Literature supplied.   2) Diabetic complications:  Eye: Does not have known diabetic retinopathy.  Neuro/ Feet: Does not have known diabetic peripheral neuropathy. Renal: Patient does not have known baseline CKD. He is not on an ACEI/ARB at present.   3) Dyslipidemia:  -His LDL and TG has trended down -I have  pravastatin 40 mg to atorvastatin 20 mg, he is tolerating it well   Medication  Continue atorvastatin 20 mg daily    Follow-up in 4 months   Signed electronically by: Mack Guise, MD  [facility] Endocrinology  [facility] [address] [place], [place] [address] Phone: [phone] FAX: [phone]   CC: London Pepper, MD [address] [address] Phone: [phone]  Fax: [phone]    Return to  Endocrinology clinic as below: No future appointments.

## 2022-02-21 ENCOUNTER — Other Ambulatory Visit (HOSPITAL_COMMUNITY): Payer: Self-pay

## 2022-02-21 ENCOUNTER — Telehealth: Payer: Self-pay | Admitting: Pharmacy Technician

## 2022-02-21 ENCOUNTER — Other Ambulatory Visit: Payer: Self-pay | Admitting: Internal Medicine

## 2022-02-21 MED ORDER — METFORMIN HCL ER 500 MG PO TB24: 2000.0000 mg | ORAL_TABLET | Freq: Every day | ORAL | 3 refills | Status: DC

## 2022-02-21 NOTE — Telephone Encounter (Signed)
Patient Advocate Encounter   Received notification from [employer] that prior authorization for Metformin ER 500mg  (OSM) is required/requested.  Per Test Claim: Regular metformin (generic glucophage) is covered at $0, but the one ordered (generic Fortamet) is not

## 2022-02-23 NOTE — Telephone Encounter (Signed)
Received a fax regarding Prior Authorization from [facility] for Metformin Tab 500mg  Er.   Authorization has been DENIED because:   "Based on the information provided, you do not meet the established medication-specific criteria or guidelines for Metformin Hydrochloride Er at this time. The request for coverage for METFORMIN TAB 500MG  ER, use as directed (120 per month), is denied. This decision is based on health plan criteria for METFORMIN TAB 500MG  ER. This medicine is covered only if: One of the following: (A) Your doctor submits medical records (for example, chart notes, laboratory values) documenting an inadequate response to metformin extended-release (generic Glucophage XR) as evidenced by a hemoglobin A1c level that is above goal if you have a diagnosis of diabetes. (B) Your doctor submits medical records (for example, chart notes, laboratory values) documenting an intolerance to metformin extended-release (generic Glucophage XR) which is unable to be resolved with attempts to minimize the adverse effects where appropriate (for example, dose reduction). The information provided does not show that you meet the criteria listed above.Marland Kitchen  Phone# [phone]

## 2022-02-24 ENCOUNTER — Other Ambulatory Visit (HOSPITAL_COMMUNITY): Payer: Self-pay

## 2022-02-24 NOTE — Telephone Encounter (Signed)
Patient Advocate Encounter  Review of denial letter shows that this medication request was listed as (Osmotic) Metformin. This insurance plan does cover (Modified) Metformin ER, and test billing indicates that  (MOD) Metformin ER was filled at a retail location on 02/21/22.  PA not needed unless OSM product is required.  Clista Bernhardt, CPhT Rx Patient Advocate Phone: [phone]

## 2022-03-03 ENCOUNTER — Ambulatory Visit (INDEPENDENT_AMBULATORY_CARE_PROVIDER_SITE_OTHER): Payer: 59

## 2022-03-03 ENCOUNTER — Ambulatory Visit
Admission: EM | Admit: 2022-03-03 | Discharge: 2022-03-03 | Disposition: A | Discharge status: Home / Self Care | Payer: 59 | Attending: Urgent Care | Admitting: Urgent Care

## 2022-03-03 ENCOUNTER — Encounter: Payer: Self-pay | Admitting: Urgent Care

## 2022-03-03 DIAGNOSIS — M542 Cervicalgia: Secondary | ICD-10-CM | POA: Diagnosis not present

## 2022-03-03 DIAGNOSIS — B029 Zoster without complications: Secondary | ICD-10-CM

## 2022-03-03 DIAGNOSIS — M5412 Radiculopathy, cervical region: Secondary | ICD-10-CM

## 2022-03-03 DIAGNOSIS — E119 Type 2 diabetes mellitus without complications: Secondary | ICD-10-CM | POA: Diagnosis not present

## 2022-03-03 DIAGNOSIS — M5030 Other cervical disc degeneration, unspecified cervical region: Secondary | ICD-10-CM

## 2022-03-03 MED ORDER — VALACYCLOVIR HCL 1 G PO TABS: 1000.0000 mg | ORAL_TABLET | Freq: Three times a day (TID) | ORAL | 0 refills | Status: AC

## 2022-03-03 MED ORDER — ACETAMINOPHEN 325 MG PO TABS: 650.0000 mg | ORAL_TABLET | Freq: Four times a day (QID) | ORAL | 0 refills | Status: AC | PRN

## 2022-03-03 MED ORDER — CYCLOBENZAPRINE HCL 5 MG PO TABS: 5.0000 mg | ORAL_TABLET | Freq: Every evening | ORAL | 0 refills | Status: AC | PRN

## 2022-03-03 NOTE — ED Provider Notes (Signed)
[facility]  Note:  This document was prepared using Conservation officer, historic buildings and may include unintentional dictation errors.  MRN: [id number] DOB: September 19, 1970  Subjective:   Kirk Cox is a 51 y.o. male presenting for 1 week history of acute onset left-sided neck pain that radiates down the left arm to the elbow.  Reports slight tingling as well.  No fall, trauma.  No chest pain, heart racing, palpitations, diaphoresis, nausea, vomiting.  Patient is also had a painful rash to the left buttock.  Has type 2 diabetes treated without insulin.  Last A1c 2 weeks ago was 11.2%.  No current facility-administered medications for this encounter.  Current Outpatient Medications:    clotrimazole-betamethasone (LOTRISONE) cream, Apply topically daily as needed., Disp: , Rfl:    empagliflozin (JARDIANCE) 25 MG TABS tablet, Take 1 tablet (25 mg total) by mouth daily before breakfast., Disp: 90 tablet, Rfl: 3   glipiZIDE (GLIPIZIDE XL) 10 MG 24 hr tablet, Take 1 tablet (10 mg total) by mouth daily with breakfast., Disp: 90 tablet, Rfl: 5   glucose blood test strip, OneTouch Verio test strips  USE TO CHECK EVERY MORNING,ALTERNATING BETWEEN MORNING AND EVENING MEALS 2 3 TIMES A DAY, Disp: , Rfl:    ibuprofen (ADVIL) 800 MG tablet, Take 800 mg by mouth every 6 (six) hours as needed., Disp: , Rfl:    Lancets (ONETOUCH DELICA PLUS LANCET33G) MISC, Apply topically daily., Disp: , Rfl:    lisinopril (PRINIVIL,ZESTRIL) 5 MG tablet, Take 5 mg by mouth daily., Disp: , Rfl:    LORazepam (ATIVAN) 1 MG tablet, Take 1 mg by mouth daily as needed., Disp: , Rfl:    metFORMIN (GLUCOPHAGE-XR) 500 MG 24 hr tablet, Take 4 tablets (2,000 mg total) by mouth daily with breakfast., Disp: 360 tablet, Rfl: 3   pravastatin (PRAVACHOL) 40 MG tablet, Take 40 mg by mouth in the morning and at bedtime., Disp: , Rfl:    sildenafil (VIAGRA) 50 MG tablet, Take 50 mg by mouth daily as needed., Disp: ,  Rfl:    valACYclovir (VALTREX) 500 MG tablet, valacyclovir 500 mg tablet, Disp: , Rfl:    No Known Allergies  Past Medical History:  Diagnosis Date   Blood in stool    pt noticed this 3 weks ago from 03-03-2021   Diabetes mellitus without complication (HCC)    Hyperlipidemia    Hypertension      Past Surgical History:  Procedure Laterality Date   CHOLECYSTECTOMY N/A 08/21/2012   Procedure: LAPAROSCOPIC CHOLECYSTECTOMY WITH INTRAOPERATIVE CHOLANGIOGRAM;  Surgeon: Wilmon Arms. Corliss Skains, MD;  Location: [facility];  Service: General;  Laterality: N/A;   COLONOSCOPY  09/23/2010   normal   HERNIA REPAIR     UMBILICAL HERNIA REPAIR  08/21/2012   Procedure: HERNIA REPAIR UMBILICAL ADULT;  Surgeon: Wilmon Arms. Tsuei, MD;  Location: [facility];  Service: General;;    Family History  Problem Relation Age of Onset   Diabetes Mother    Diabetes Father    Colon cancer Neg Hx    Colon polyps Neg Hx    Esophageal cancer Neg Hx    Rectal cancer Neg Hx    Stomach cancer Neg Hx     Social History   Tobacco Use   Smoking status: Former    Types: Cigarettes    Quit date: 05/2018    Years since quitting: 3.7   Smokeless tobacco: Never  Vaping Use   Vaping Use: Former  Substance Use  Topics   Alcohol use: Yes    Comment: Social.   Drug use: No    ROS   Objective:   Vitals: BP (!) 144/87 (BP Location: Right Arm)   Pulse 72   Temp 98.6 F (37 C) (Oral)   Resp 16   SpO2 96%   Physical Exam Constitutional:      General: He is not in acute distress.    Appearance: Normal appearance. He is well-developed and normal weight. He is not ill-appearing, toxic-appearing or diaphoretic.  HENT:     Head: Normocephalic and atraumatic.     Right Ear: External ear normal.     Left Ear: External ear normal.     Nose: Nose normal.     Mouth/Throat:     Mouth: Mucous membranes are moist.  Eyes:     General: No scleral icterus.       Right eye: No discharge.        Left eye: No discharge.      Extraocular Movements: Extraocular movements intact.  Cardiovascular:     Rate and Rhythm: Normal rate and regular rhythm.     Heart sounds: Normal heart sounds. No murmur heard.    No friction rub. No gallop.  Pulmonary:     Effort: Pulmonary effort is normal. No respiratory distress.     Breath sounds: Normal breath sounds. No stridor. No wheezing, rhonchi or rales.  Musculoskeletal:     Cervical back: Normal range of motion. Tenderness (along area outlined) present. No swelling, edema, deformity, erythema, signs of trauma, lacerations, rigidity, spasms, torticollis, bony tenderness or crepitus. Pain with movement present. Normal range of motion.       Back:  Neurological:     Mental Status: He is alert and oriented to person, place, and time.  Psychiatric:        Mood and Affect: Mood normal.        Behavior: Behavior normal.        Thought Content: Thought content normal.        Judgment: Judgment normal.       DG Cervical Spine Complete  Result Date: 03/03/2022 CLINICAL DATA:  Neck pain radiating down the left arm. EXAM: CERVICAL SPINE - COMPLETE 4+ VIEW COMPARISON:  None Available. FINDINGS: Normal anatomic alignment. C5-6 degenerative disc disease. Preservation of the vertebral body heights. Prevertebral soft tissues are unremarkable. No acute fracture. Lung apices are clear. IMPRESSION: C5-6 degenerative disc disease.  No acute process. Electronically Signed   By: Lovey Newcomer M.D.   On: 03/03/2022 18:49    Assessment and Plan :   PDMP not reviewed this encounter.  1. Cervical radiculopathy   2. Herpes zoster without complication   3. Type 2 diabetes mellitus treated without insulin (HCC)     Low suspicion for ACS given the left sided neck pain.  Symptoms more consistent with cervical radiculopathy.  Deferred EKG as such.  Will use conservative management with Tylenol, Flexeril.  Patient needs to follow-up with PCP, consider MRI or referral to a neuro spine specialist.  He  does have uncontrolled type 2 diabetes and is not currently being managed with insulin which I suspect is the reason he is having shingles of the left buttock.  Recommended valacyclovir. Counseled patient on potential for adverse effects with medications prescribed/recommended today, ER and return-to-clinic precautions discussed, patient verbalized understanding.    Jaynee Eagles, [place] 03/03/22 [id number]

## 2022-03-03 NOTE — ED Triage Notes (Signed)
Pt states left sided neck pain that radiates down left arm. Also states rash to left buttocks.

## 2022-08-24 ENCOUNTER — Ambulatory Visit: Payer: 59 | Admitting: Internal Medicine

## 2022-08-24 NOTE — Progress Notes (Deleted)
Name: Kirk Cox  MRN/ DOB: [id number], 17-Nov-1970   Age/ Sex: 52 y.o., male    PCP: London Pepper, MD   Reason for Endocrinology Evaluation: Type 2 Diabetes Mellitus     Date of Initial Endocrinology Visit: 12/28/2020    PATIENT IDENTIFIER: Kirk Cox Cox a 52 y.o. male with a past medical history of T2DM, HTN and Dyslipidemia. The patient presented for initial endocrinology clinic visit on 12/28/2020 for consultative assistance with his diabetes management.    HPI: Kirk Cox was    Diagnosed with DM 2019 Hemoglobin A1c has ranged from 6.7% in 02/2020, peaking at 12.2% in 2022.  On his initial visit to our clinic his A1c was 11.3%, he was on glipizide XL, metformin, and Jardiance    We switched pravastatin to atorvastatin 07/2021 ( LDL 94 at the time )   Switch Glipizide to XL because he would forget the 2nd dose   SUBJECTIVE:   During the last visit (02/17/2022): A1c 11.3 %   Today (03/31/2021: Kirk Cox here for follow-up on diabetes management.he checks his blood sugars 1 times daily.  He did not bring his meter today. The patient has not had hypoglycemic episodes since the last clinic visit.    He was having issues with Metformin causing loose stools but this Cox improving  Today he drank a margarita before his visit today    HOME DIABETES REGIMEN: Metformin 500 mg 2 tabs BID Glipizide 10 mg  XL daily  Jardiance 25 mg daily  Atorvastatin 20 mg daily   Statin: yes ACE-I/ARB: yes Prior Diabetic Education: yes    METER DOWNLOAD SUMMARY: did not bring    DIABETIC COMPLICATIONS: Microvascular complications:    Denies: CKD, retinopathy, neuropathy  Last eye exam: Completed 05/2021  Macrovascular complications:   Denies: CAD, PVD, CVA   PAST HISTORY: Past Medical History:  Past Medical History:  Diagnosis Date   Blood in stool    pt noticed this 3 weks ago from 03-03-2021   Diabetes mellitus without complication ([place])    Hyperlipidemia     Hypertension    Past Surgical History:  Past Surgical History:  Procedure Laterality Date   CHOLECYSTECTOMY N/A 08/21/2012   Procedure: LAPAROSCOPIC CHOLECYSTECTOMY WITH INTRAOPERATIVE CHOLANGIOGRAM;  Surgeon: Imogene Burn. Georgette Dover, MD;  Location: [facility];  Service: General;  Laterality: N/A;   COLONOSCOPY  09/23/2010   normal   HERNIA REPAIR     UMBILICAL HERNIA REPAIR  08/21/2012   Procedure: HERNIA REPAIR UMBILICAL ADULT;  Surgeon: Imogene Burn. Georgette Dover, MD;  Location: [facility];  Service: General;;    Social History:  reports that he quit smoking about 4 years ago. His smoking use included cigarettes. He has never used smokeless tobacco. He reports current alcohol use. He reports that he does not use drugs. Family History:  Family History  Problem Relation Age of Onset   Diabetes Mother    Diabetes Father    Colon cancer Neg Hx    Colon polyps Neg Hx    Esophageal cancer Neg Hx    Rectal cancer Neg Hx    Stomach cancer Neg Hx      HOME MEDICATIONS: Allergies as of 08/24/2022   No Known Allergies      Medication List        Accurate as of August 24, 2022 10:11 AM. If you have any questions, ask your nurse or doctor.          acetaminophen 325 MG tablet  Commonly known as: Tylenol Take 2 tablets (650 mg total) by mouth every 6 (six) hours as needed for moderate pain.   clotrimazole-betamethasone cream Commonly known as: LOTRISONE Apply topically daily as needed.   cyclobenzaprine 5 MG tablet Commonly known as: FLEXERIL Take 1 tablet (5 mg total) by mouth at bedtime as needed for muscle spasms.   empagliflozin 25 MG Tabs tablet Commonly known as: Jardiance Take 1 tablet (25 mg total) by mouth daily before breakfast.   glipiZIDE 10 MG 24 hr tablet Commonly known as: glipiZIDE XL Take 1 tablet (10 mg total) by mouth daily with breakfast.   glucose blood test strip OneTouch Verio test strips  USE TO CHECK EVERY MORNING,ALTERNATING BETWEEN MORNING AND EVENING MEALS 2 3  TIMES A DAY   ibuprofen 800 MG tablet Commonly known as: ADVIL Take 800 mg by mouth every 6 (six) hours as needed.   lisinopril 5 MG tablet Commonly known as: ZESTRIL Take 5 mg by mouth daily.   LORazepam 1 MG tablet Commonly known as: ATIVAN Take 1 mg by mouth daily as needed.   metFORMIN 500 MG 24 hr tablet Commonly known as: GLUCOPHAGE-XR Take 4 tablets (2,000 mg total) by mouth daily with breakfast.   OneTouch Delica Plus 0000000 Misc Apply topically daily.   pravastatin 40 MG tablet Commonly known as: PRAVACHOL Take 40 mg by mouth in the morning and at bedtime.   sildenafil 50 MG tablet Commonly known as: VIAGRA Take 50 mg by mouth daily as needed.         ALLERGIES: No Known Allergies      OBJECTIVE:   VITAL SIGNS: There were no vitals taken for this visit.   PHYSICAL EXAM:  General: Pt appears well and Cox in NAD  Lungs: Clear with good BS bilat with no rales, rhonchi, or wheezes  Heart: RRR   Abdomen: Normoactive bowel sounds, soft, nontender  Extremities:  Lower extremities - No pretibial edema. No lesions.  Neuro: MS Cox good with appropriate affect, pt Cox alert and Ox3    DM foot exam: 03/31/2021  The skin of the feet Cox intact without sores or ulcerations. The pedal pulses are 2+ on right and 2+ on left. The sensation Cox intact to a screening 5.07, 10 gram monofilament bilaterally    DATA REVIEWED:    Latest Reference Range & Units 07/28/21 11:43  Sodium 135 - 145 mEq/L 136  Potassium 3.5 - 5.1 mEq/L 4.3  Chloride 96 - 112 mEq/L 104  CO2 19 - 32 mEq/L 24  Glucose 70 - 99 mg/dL 148 (H)  BUN 6 - 23 mg/dL 12  Creatinine 0.40 - 1.50 mg/dL 1.09  Calcium 8.4 - 10.5 mg/dL 10.1  GFR >60.00 mL/min 78.07  Total CHOL/HDL Ratio  3  Cholesterol 0 - 200 mg/dL 155  HDL Cholesterol >39.00 mg/dL 44.90  LDL (calc) 0 - 99 mg/dL 94  MICROALB/CREAT RATIO 0.0 - 30.0 mg/g 1.7  NonHDL  109.74  Triglycerides 0.0 - 149.0 mg/dL 78.0  VLDL 0.0 - 40.0  mg/dL 15.6    Latest Reference Range & Units 07/28/21 11:43  Creatinine,U mg/dL 43.8  Microalb, Ur 0.0 - 1.9 mg/dL 0.7  MICROALB/CREAT RATIO 0.0 - 30.0 mg/g 1.7      ASSESSMENT / PLAN / RECOMMENDATIONS:   1) Type 2 Diabetes Mellitus, Poorly controlled, Without complications - Most recent A1c of 11.3 %. Goal A1c < 7.0 %.    -Patient with worsening glycemic control, patient admits to dietary indiscretions -I  suspect there Cox medication nonadherence as well -I will increase glipizide as below, if this remains elevated we will consider starting him on insulin   MEDICATIONS: Take metformin 500 mg XR , 4 tablets daily Continue Jardiance 25 mg, 1 tablet in the morning  Increase glipizide 10 mg XL, 1 tablet before Breakfast   EDUCATION / INSTRUCTIONS: BG monitoring instructions: Patient Cox instructed to check his blood sugars 1 times a day, fasting. Call [facility] if: BG persistently < 70  I reviewed the Rule of 15 for the treatment of hypoglycemia in detail with the patient. Literature supplied.   2) Diabetic complications:  Eye: Does not have known diabetic retinopathy.  Neuro/ Feet: Does not have known diabetic peripheral neuropathy. Renal: Patient does not have known baseline CKD. He Cox not on an ACEI/ARB at present.   3) Dyslipidemia:  -His LDL and TG has trended down -I have  pravastatin 40 mg to atorvastatin 20 mg, he Cox tolerating it well   Medication  Continue atorvastatin 20 mg daily    Follow-up in 4 months   Signed electronically by: Mack Guise, MD  [facility]  [facility] [address] [place], [place] [address] Phone: [phone] FAX: [phone]   CC: London Pepper, MD [place] [address] Phone: [phone]  Fax: [phone]    Return to Endocrinology clinic as below: Future Appointments  Date Time Provider [place]  08/24/2022   1:00 PM Dellis Voght, Melanie Crazier, MD [id number] None

## 2022-10-19 ENCOUNTER — Ambulatory Visit: Payer: 59 | Admitting: Internal Medicine

## 2023-01-04 LAB — HEMOGLOBIN A1C: Hemoglobin A1C: 11.5

## 2023-01-26 ENCOUNTER — Encounter: Payer: Self-pay | Admitting: Internal Medicine

## 2023-02-28 NOTE — Progress Notes (Unsigned)
Name: Kirk Cox  MRN/ DOB: [id number], [birth year]   Age/ Sex: 52 y.o., male    PCP: Farris Has, MD   Reason for Endocrinology Evaluation: Type 2 Diabetes Mellitus     Date of Initial Endocrinology Visit: 12/28/2020    PATIENT IDENTIFIER: Kirk Cox is a 52 y.o. male with a past medical history of T2DM, HTN and Dyslipidemia. The patient presented for initial endocrinology clinic visit on 12/28/2020 for consultative assistance with his diabetes management.    HPI: Kirk Cox was    Diagnosed with DM 2019 Hemoglobin A1c has ranged from 6.7% in 02/2020, peaking at 12.2% in 2022.  On his initial visit to our clinic his A1c was 11.3%, he was on glipizide XL, metformin, and Jardiance    We switched pravastatin to atorvastatin 07/2021 ( LDL 94 at the time )   Switch Glipizide to XL because he would forget the 2nd dose   SUBJECTIVE:   During the last visit (02/17/2022): A1c 11.3 %     Today (03/01/23): Kirk Cox is here for follow-up on diabetes management.he checks his blood sugars occasionally . The patient has not had hypoglycemic episodes since the last clinic visit.   Ran out of his glycemic agents ~ 3 weeks ago  Denies nausea or vomiting  Denies constipation or diarrhea   HOME DIABETES REGIMEN: Metformin 500 mg 2 tabs BID Glipizide 10 mg  XL daily  Jardiance 25 mg daily  Atorvastatin 20 mg daily   Statin: yes ACE-I/ARB: yes Prior Diabetic Education: yes    METER DOWNLOAD SUMMARY: did not bring    DIABETIC COMPLICATIONS: Microvascular complications:    Denies: CKD, retinopathy, neuropathy  Last eye exam: Completed 05/2022  Macrovascular complications:   Denies: CAD, PVD, CVA   PAST HISTORY: Past Medical History:  Past Medical History:  Diagnosis Date   Blood in stool    pt noticed this 3 weks ago from 03-03-2021   Diabetes mellitus without complication (HCC)    Hyperlipidemia    Hypertension    Past Surgical History:  Past  Surgical History:  Procedure Laterality Date   CHOLECYSTECTOMY N/A 08/21/2012   Procedure: LAPAROSCOPIC CHOLECYSTECTOMY WITH INTRAOPERATIVE CHOLANGIOGRAM;  Surgeon: Wilmon Arms. Corliss Skains, MD;  Location: [facility];  Service: General;  Laterality: N/A;   COLONOSCOPY  09/23/2010   normal   HERNIA REPAIR     UMBILICAL HERNIA REPAIR  08/21/2012   Procedure: HERNIA REPAIR UMBILICAL ADULT;  Surgeon: Wilmon Arms. Corliss Skains, MD;  Location: [facility];  Service: General;;    Social History:  reports that he quit smoking about 4 years ago. His smoking use included cigarettes. He has never used smokeless tobacco. He reports current alcohol use. He reports that he does not use drugs. Family History:  Family History  Problem Relation Age of Onset   Diabetes Mother    Diabetes Father    Colon cancer Neg Hx    Colon polyps Neg Hx    Esophageal cancer Neg Hx    Rectal cancer Neg Hx    Stomach cancer Neg Hx      HOME MEDICATIONS: Allergies as of 03/01/2023   No Known Allergies      Medication List        Accurate as of March 01, 2023 12:20 PM. If you have any questions, ask your nurse or doctor.          acetaminophen 325 MG tablet Commonly known as: Tylenol Take 2 tablets (650 mg total) by mouth  every 6 (six) hours as needed for moderate pain.   clotrimazole-betamethasone cream Commonly known as: LOTRISONE Apply topically daily as needed.   cyclobenzaprine 5 MG tablet Commonly known as: FLEXERIL Take 1 tablet (5 mg total) by mouth at bedtime as needed for muscle spasms.   Dilaudid 2 MG tablet Generic drug: HYDROmorphone Take 1 tablet every 4-6 hours by oral route as needed for pain for 7 days.   empagliflozin 25 MG Tabs tablet Commonly known as: Jardiance Take 1 tablet (25 mg total) by mouth daily before breakfast.   glipiZIDE 10 MG 24 hr tablet Commonly known as: glipiZIDE XL Take 1 tablet (10 mg total) by mouth daily with breakfast.   glucose blood test strip OneTouch Verio test  strips  USE TO CHECK EVERY MORNING,ALTERNATING BETWEEN MORNING AND EVENING MEALS 2 3 TIMES A DAY   ibuprofen 800 MG tablet Commonly known as: ADVIL Take 800 mg by mouth every 6 (six) hours as needed.   lisinopril 5 MG tablet Commonly known as: ZESTRIL Take 5 mg by mouth daily.   LORazepam 1 MG tablet Commonly known as: ATIVAN Take 1 mg by mouth daily as needed.   metFORMIN 500 MG 24 hr tablet Commonly known as: GLUCOPHAGE-XR Take 4 tablets (2,000 mg total) by mouth daily with breakfast.   OneTouch Delica Plus Lancet33G Misc Apply topically daily.   oxyCODONE-acetaminophen 10-325 MG tablet Commonly known as: PERCOCET TAKE 1 TABLET BY MOUTH EVERY 6 HOURS AS DIRECTED FOR 7 DAYS   pravastatin 40 MG tablet Commonly known as: PRAVACHOL Take 40 mg by mouth in the morning and at bedtime.   rosuvastatin 10 MG tablet Commonly known as: CRESTOR TAKE 1 TABLET BY MOUTH EVERY DAY FOR 90 DAYS   rosuvastatin 20 MG tablet Commonly known as: CRESTOR 1 tablet Orally Once a day for 90 days   sildenafil 50 MG tablet Commonly known as: VIAGRA Take 50 mg by mouth daily as needed.   valACYclovir 1000 MG tablet Commonly known as: VALTREX         ALLERGIES: No Known Allergies      OBJECTIVE:   VITAL SIGNS: BP 124/78 (BP Location: Left Arm, Patient Position: Sitting, Cuff Size: Large)   Pulse 69   Ht 5' 6.5" (1.689 m)   Wt 209 lb (94.8 kg)   SpO2 98%   BMI 33.23 kg/m    PHYSICAL EXAM:  General: Pt appears well and is in NAD  Lungs: Clear with good BS bilat   Heart: RRR   Abdomen: Normoactive bowel sounds, soft, nontender  Extremities:  Lower extremities - No pretibial edema.   Neuro: MS is good with appropriate affect, pt is alert and Ox3    DM foot exam: 03/01/2023  The skin of the feet is intact without sores or ulcerations. The pedal pulses are 2+ on right and 2+ on left. The sensation is intact to a screening 5.07, 10 gram monofilament bilaterally    DATA  REVIEWED:  01/04/2023 BUN 14 CR 1.08 GFR 82 HDL 47 LDL 157 MA/CR 58.6 TG 107   Old records , labs and images have been reviewed.    ASSESSMENT / PLAN / RECOMMENDATIONS:   1) Type 2 Diabetes Mellitus, Poorly controlled, Without complications - Most recent A1c of 9.2%. Goal A1c < 7.0 %.    -His A1c has trended down from 11.5% to 9.2%  -Patient tells me he has been out of glycemic agents for approximately 3 weeks? -I have recommended GLP-1 agonist, patient does  not want to take any injections so we opted with Rybelsus, caution against GI side effects -Discussed importance of low-carb diet and avoiding sugar sweetened beverages -He was provided with #30 sample of Rybelsus 3 mg   MEDICATIONS: Take metformin 500 mg XR , 4 tablets daily Continue Jardiance 25 mg, 1 tablet in the morning  Continue glipizide 10 mg XL, 1 tablet before Breakfast  Start Rybelsus 3 mg daily for 30 days then increase to 7 mg daily  EDUCATION / INSTRUCTIONS: BG monitoring instructions: Patient is instructed to check his blood sugars 1 times a day, fasting. Call [facility] if: BG persistently < 70  I reviewed the Rule of 15 for the treatment of hypoglycemia in detail with the patient. Literature supplied.   2) Diabetic complications:  Eye: Does not have known diabetic retinopathy.  Neuro/ Feet: Does not have known diabetic peripheral neuropathy. Renal: Patient does not have known baseline CKD. He is not on an ACEI/ARB at present.   3) Dyslipidemia:  -His LDL continues to be above goal, I suspect compliance issues -I have  pravastatin 40 mg to atorvastatin 20 mg, in 2023 -I will refill atorvastatin and continue to monitor  Medication  Continue atorvastatin 20 mg daily    Follow-up in 4 months   Signed electronically by: Lyndle Herrlich, MD  [facility]  [facility] [address] [place] [place], [place] [address] Phone:  [phone] FAX: [phone]   CC: Farris Has, MD [address] [place] [address] Phone: [phone]  Fax: [phone]    Return to Endocrinology clinic as below: No future appointments.

## 2023-03-01 ENCOUNTER — Ambulatory Visit: Payer: 59 | Admitting: Internal Medicine

## 2023-03-01 ENCOUNTER — Telehealth: Payer: Self-pay

## 2023-03-01 ENCOUNTER — Encounter: Payer: Self-pay | Admitting: Internal Medicine

## 2023-03-01 VITALS — BP 124/78 | HR 69 | Ht 66.5 in | Wt 209.0 lb

## 2023-03-01 DIAGNOSIS — Z7984 Long term (current) use of oral hypoglycemic drugs: Secondary | ICD-10-CM

## 2023-03-01 DIAGNOSIS — E785 Hyperlipidemia, unspecified: Secondary | ICD-10-CM

## 2023-03-01 DIAGNOSIS — E1165 Type 2 diabetes mellitus with hyperglycemia: Secondary | ICD-10-CM | POA: Diagnosis not present

## 2023-03-01 LAB — POCT GLYCOSYLATED HEMOGLOBIN (HGB A1C): Hemoglobin A1C: 9.2 % — AB (ref 4.0–5.6)

## 2023-03-01 LAB — POCT GLUCOSE (DEVICE FOR HOME USE): Glucose Fasting, POC: 278 mg/dL — AB (ref 70–99)

## 2023-03-01 MED ORDER — EMPAGLIFLOZIN 25 MG PO TABS: 25.0000 mg | ORAL_TABLET | Freq: Every day | ORAL | 3 refills | Status: DC

## 2023-03-01 MED ORDER — RYBELSUS 7 MG PO TABS: 7.0000 mg | ORAL_TABLET | Freq: Every day | ORAL | 3 refills | Status: DC

## 2023-03-01 MED ORDER — METFORMIN HCL ER 500 MG PO TB24: 2000.0000 mg | ORAL_TABLET | Freq: Every day | ORAL | 3 refills | Status: DC

## 2023-03-01 MED ORDER — ROSUVASTATIN CALCIUM 20 MG PO TABS: 20.0000 mg | ORAL_TABLET | Freq: Every day | ORAL | 3 refills | Status: DC

## 2023-03-01 MED ORDER — GLIPIZIDE ER 10 MG PO TB24: 10.0000 mg | ORAL_TABLET | Freq: Every day | ORAL | 5 refills | Status: DC

## 2023-03-01 NOTE — Telephone Encounter (Signed)
Medication Samples have been provided to the patient.  Drug name: Rybelsus       Strength: 3mg         Qty: 1  LOT: [id number]  Exp.Date: 04/2023   Dosing instructions: 1 tab daily   The patient has been instructed regarding the correct time, dose, and frequency of taking this medication, including desired effects and most common side effects.   Brennyn Ortlieb L Tiernan Suto 1:06 PM 03/01/2023

## 2023-03-01 NOTE — Patient Instructions (Addendum)
-   Start Rybelsus 3 mg, 1 tablet half an hour before Breakfast for a month, than increase to 7 mg daily  - Continue Glipizide 10 mg XL, 1 tablet before Breakfast  - Take  Metformin 500 mg XR , 4 Tablets before  Breakfast  - Continue  Jardiance 25 mg, 1 tablet in the morning       HOW TO TREAT LOW BLOOD SUGARS (Blood sugar LESS THAN 70 MG/DL) Please follow the RULE OF 15 for the treatment of hypoglycemia treatment (when your (blood sugars are less than 70 mg/dL)   STEP 1: Take 15 grams of carbohydrates when your blood sugar is low, which includes:  3-4 GLUCOSE TABS  OR 3-4 OZ OF JUICE OR REGULAR SODA OR ONE TUBE OF GLUCOSE GEL    STEP 2: RECHECK blood sugar in 15 MINUTES STEP 3: If your blood sugar is still low at the 15 minute recheck --> then, go back to STEP 1 and treat AGAIN with another 15 grams of carbohydrates.

## 2023-07-05 ENCOUNTER — Encounter: Payer: Self-pay | Admitting: Internal Medicine

## 2023-07-05 ENCOUNTER — Ambulatory Visit: Payer: 59 | Admitting: Internal Medicine

## 2023-07-05 VITALS — BP 124/86 | HR 78 | Ht 66.5 in | Wt 210.0 lb

## 2023-07-05 DIAGNOSIS — E785 Hyperlipidemia, unspecified: Secondary | ICD-10-CM | POA: Diagnosis not present

## 2023-07-05 DIAGNOSIS — E1165 Type 2 diabetes mellitus with hyperglycemia: Secondary | ICD-10-CM

## 2023-07-05 DIAGNOSIS — Z7984 Long term (current) use of oral hypoglycemic drugs: Secondary | ICD-10-CM | POA: Diagnosis not present

## 2023-07-05 LAB — POCT GLYCOSYLATED HEMOGLOBIN (HGB A1C): Hemoglobin A1C: 11.7 % — AB (ref 4.0–5.6)

## 2023-07-05 LAB — POCT GLUCOSE (DEVICE FOR HOME USE): POC Glucose: 386 mg/dL — AB (ref 70–99)

## 2023-07-05 MED ORDER — EMPAGLIFLOZIN 25 MG PO TABS: 25.0000 mg | ORAL_TABLET | Freq: Every day | ORAL | 3 refills | Status: AC

## 2023-07-05 MED ORDER — RYBELSUS 14 MG PO TABS: 14.0000 mg | ORAL_TABLET | Freq: Every day | ORAL | 3 refills | Status: AC

## 2023-07-05 MED ORDER — DEXCOM G7 SENSOR MISC: 1.0000 | 3 refills | Status: DC

## 2023-07-05 MED ORDER — GLIPIZIDE ER 10 MG PO TB24: 20.0000 mg | ORAL_TABLET | Freq: Every day | ORAL | 3 refills | Status: AC

## 2023-07-05 MED ORDER — ROSUVASTATIN CALCIUM 20 MG PO TABS: 20.0000 mg | ORAL_TABLET | Freq: Every day | ORAL | 3 refills | Status: AC

## 2023-07-05 NOTE — Patient Instructions (Signed)
-  Increase Rybelsus  14 mg daily  -Increase Glipizide  10 mg XL, 2 tablets before Breakfast  - Continue  Jardiance  25 mg, 1 tablet in the morning       HOW TO TREAT LOW BLOOD SUGARS (Blood sugar LESS THAN 70 MG/DL) Please follow the RULE OF 15 for the treatment of hypoglycemia treatment (when your (blood sugars are less than 70 mg/dL)   STEP 1: Take 15 grams of carbohydrates when your blood sugar is low, which includes:  3-4 GLUCOSE TABS  OR 3-4 OZ OF JUICE OR REGULAR SODA OR ONE TUBE OF GLUCOSE GEL    STEP 2: RECHECK blood sugar in 15 MINUTES STEP 3: If your blood sugar is still low at the 15 minute recheck --> then, go back to STEP 1 and treat AGAIN with another 15 grams of carbohydrates.

## 2023-07-05 NOTE — Progress Notes (Signed)
 Name: Kirk Cox  MRN/ DOB: [id number], 1970/07/07   Age/ Sex: 53 y.o., male    PCP: Kip Righter, MD   Reason for Endocrinology Evaluation: Type 2 Diabetes Mellitus     Date of Initial Endocrinology Visit: 12/28/2020    PATIENT IDENTIFIER: Kirk Cox is a 53 y.o. male with a past medical history of T2DM, HTN and Dyslipidemia. The patient presented for initial endocrinology clinic visit on 12/28/2020 for consultative assistance with his diabetes management.    HPI: Kirk Cox was    Diagnosed with DM 2019 Hemoglobin A1c has ranged from 6.7% in 02/2020, peaking at 12.2% in 2022.  On his initial visit to our clinic his A1c was 11.3%, he was on glipizide  XL, metformin , and Jardiance     We switched pravastatin to atorvastatin  07/2021 ( LDL 94 at the time )   Switch Glipizide  to XL because he would forget the 2nd dose   Started Rybelsus  02/2023  Patient was not taking metformin  by 07/2023 and we discontinued it  SUBJECTIVE:   During the last visit (03/01/2023): A1c 9.2 %     Today (07/05/23): [name] is here for follow-up on diabetes management.He is accompanied  by his girlfriend . He checks his blood sugars occasionally . The patient has not had hypoglycemic episodes since the last clinic visit.  Denies nausea or vomiting  Denies constipation but has occasional  diarrhea with Metformin     HOME DIABETES REGIMEN: Metformin  500 mg 4 tabs daily- not taking  Glipizide  10 mg  XL daily  Jardiance  25 mg daily Rybelsus  7 mg daily   Atorvastatin  20 mg daily- not taking    Statin: yes ACE-I/ARB: yes Prior Diabetic Education: yes    METER DOWNLOAD SUMMARY: did not bring    DIABETIC COMPLICATIONS: Microvascular complications:    Denies: CKD, retinopathy, neuropathy  Last eye exam: Completed 05/2022  Macrovascular complications:   Denies: CAD, PVD, CVA   PAST HISTORY: Past Medical History:  Past Medical History:  Diagnosis Date   Blood in stool     pt noticed this 3 weks ago from 03-03-2021   Diabetes mellitus without complication (HCC)    Hyperlipidemia    Hypertension    Past Surgical History:  Past Surgical History:  Procedure Laterality Date   CHOLECYSTECTOMY N/A 08/21/2012   Procedure: LAPAROSCOPIC CHOLECYSTECTOMY WITH INTRAOPERATIVE CHOLANGIOGRAM;  Surgeon: Donnice POUR. Belinda, MD;  Location: [facility];  Service: General;  Laterality: N/A;   COLONOSCOPY  09/23/2010   normal   HERNIA REPAIR     UMBILICAL HERNIA REPAIR  08/21/2012   Procedure: HERNIA REPAIR UMBILICAL ADULT;  Surgeon: Donnice POUR. Belinda, MD;  Location: [facility];  Service: General;;    Social History:  reports that he quit smoking about 5 years ago. His smoking use included cigarettes. He has never used smokeless tobacco. He reports current alcohol use. He reports that he does not use drugs. Family History:  Family History  Problem Relation Age of Onset   Diabetes Mother    Diabetes Father    Colon cancer Neg Hx    Colon polyps Neg Hx    Esophageal cancer Neg Hx    Rectal cancer Neg Hx    Stomach cancer Neg Hx      HOME MEDICATIONS: Allergies as of 07/05/2023   No Known Allergies      Medication List        Accurate as of July 05, 2023  2:50 PM. If you have any questions,  ask your nurse or doctor.          STOP taking these medications    metFORMIN  500 MG 24 hr tablet Commonly known as: GLUCOPHAGE -XR Stopped by: Angelmarie Ponzo J Astria Jordahl       TAKE these medications    acetaminophen  325 MG tablet Commonly known as: Tylenol  Take 2 tablets (650 mg total) by mouth every 6 (six) hours as needed for moderate pain.   clotrimazole-betamethasone  cream Commonly known as: LOTRISONE Apply topically daily as needed.   cyclobenzaprine  5 MG tablet Commonly known as: FLEXERIL  Take 1 tablet (5 mg total) by mouth at bedtime as needed for muscle spasms.   Dexcom G7 Sensor Misc 1 Device by Does not apply route as directed. Started by: Donell PARAS  Shaquilla Kehres   Dilaudid  2 MG tablet Generic drug: HYDROmorphone  Take 1 tablet every 4-6 hours by oral route as needed for pain for 7 days.   empagliflozin  25 MG Tabs tablet Commonly known as: Jardiance  Take 1 tablet (25 mg total) by mouth daily before breakfast.   glipiZIDE  10 MG 24 hr tablet Commonly known as: glipiZIDE  XL Take 2 tablets (20 mg total) by mouth daily with breakfast. What changed: how much to take Changed by: Donell PARAS Ari Bernabei   glucose blood test strip OneTouch Verio test strips  USE TO CHECK EVERY MORNING,ALTERNATING BETWEEN MORNING AND EVENING MEALS 2 3 TIMES A DAY   ibuprofen  800 MG tablet Commonly known as: ADVIL  Take 800 mg by mouth every 6 (six) hours as needed.   lisinopril 5 MG tablet Commonly known as: ZESTRIL Take 5 mg by mouth daily.   LORazepam 1 MG tablet Commonly known as: ATIVAN Take 1 mg by mouth daily as needed.   OneTouch Delica Plus Lancet33G Misc Apply topically daily.   oxyCODONE -acetaminophen  10-325 MG tablet Commonly known as: PERCOCET TAKE 1 TABLET BY MOUTH EVERY 6 HOURS AS DIRECTED FOR 7 DAYS   rosuvastatin  20 MG tablet Commonly known as: CRESTOR  Take 1 tablet (20 mg total) by mouth daily.   Rybelsus  14 MG Tabs Generic drug: Semaglutide  Take 1 tablet (14 mg total) by mouth daily. What changed:  medication strength how much to take Changed by: Elfrieda Espino J Shatonya Passon   sildenafil 50 MG tablet Commonly known as: VIAGRA Take 50 mg by mouth daily as needed.   valACYclovir  1000 MG tablet Commonly known as: VALTREX          ALLERGIES: No Known Allergies      OBJECTIVE:   VITAL SIGNS: BP 124/86 (BP Location: Left Arm, Patient Position: Sitting, Cuff Size: Normal)   Pulse 78   Ht 5' 6.5 (1.689 m)   Wt 210 lb (95.3 kg)   SpO2 98%   BMI 33.39 kg/m    PHYSICAL EXAM:  General: Pt appears well and is in NAD  Lungs: Clear with good BS bilat   Heart: RRR   Abdomen: Normoactive bowel sounds, soft, nontender   Extremities:  Lower extremities - No pretibial edema.   Neuro: MS is good with appropriate affect, pt is alert and Ox3    DM foot exam: 03/01/2023  The skin of the feet is intact without sores or ulcerations. The pedal pulses are 2+ on right and 2+ on left. The sensation is intact to a screening 5.07, 10 gram monofilament bilaterally    DATA REVIEWED: 05/09/2023 BUN 14 CR 1.09 GFR 89 HDL 48 LDL 150 TG 129   Old records , labs and images have been reviewed.    ASSESSMENT /  PLAN / RECOMMENDATIONS:   1) Type 2 Diabetes Mellitus, Poorly controlled, Without complications - Most recent A1c of 11.7%. Goal A1c < 7.0 %.    -A1c continues to be above goal -He has not been taking metformin  due to bloating sensation -Patient declines insulin , he was Daisean by my assistant on use of insulin  pens to try to convince him but he would like to avoid this at this time -I will increase Rybelsus  and glipizide  as below -A prescription of Dexcom was sent to the pharmacy and a #1 sample sensor was provided -A referral to our CDE was placed  MEDICATIONS: Stop metformin  500 mg XR , 4 tablets daily Continue Jardiance  25 mg, 1 tablet in the morning  Increase glipizide  10 mg XL, 2 tablet before Breakfast  Increase Rybelsus  14 mg daily  EDUCATION / INSTRUCTIONS: BG monitoring instructions: Patient is instructed to check his blood sugars 1 times a day, fasting. Call [facility] if: BG persistently < 70  I reviewed the Rule of 15 for the treatment of hypoglycemia in detail with the patient. Literature supplied.   2) Diabetic complications:  Eye: Does not have known diabetic retinopathy.  Neuro/ Feet: Does not have known diabetic peripheral neuropathy. Renal: Patient does not have known baseline CKD. He is not on an ACEI/ARB at present.   3) Dyslipidemia:  -His LDL continues to be above goal -I have  pravastatin 40 mg to atorvastatin  20 mg, in 2023 -He is not taking  rosuvastatin , we discussed cardiovascular benefits of statin therapy and have encouraged him to take it  Medication Take rosuvastatin  20 mg daily    Follow-up in 3 months   Signed electronically by: Stefano Redgie Butts, MD  [facility]  [facility] [address] [place] [place], [place] [address] Phone: [phone] FAX: [phone]   CC: Kip Righter, MD [address] [place] [address] Phone: [phone]  Fax: [phone]    Return to Endocrinology clinic as below: No future appointments.

## 2023-08-08 ENCOUNTER — Other Ambulatory Visit (HOSPITAL_COMMUNITY): Payer: Self-pay

## 2023-08-08 ENCOUNTER — Telehealth: Payer: Self-pay | Admitting: Pharmacy Technician

## 2023-08-08 NOTE — Telephone Encounter (Signed)
 Pharmacy Patient Advocate Encounter   Received notification from CoverMyMeds that prior authorization for Dexcom G7 Sensor is required/requested.   Insurance verification completed.   The patient is insured through [facility] .   Per test claim: PA required; PA submitted to above mentioned insurance via CoverMyMeds Key/confirmation #/EOC [facility] Status is pending

## 2023-08-18 NOTE — Telephone Encounter (Signed)
 Pharmacy Patient Advocate Encounter  Received notification from [facility] that Prior Authorization for Dexcom G7 sensor has been DENIED.  Full denial letter will be uploaded to the media tab. See denial reason below.   PA #/Case ID/Reference #:  [id number]

## 2023-08-22 MED ORDER — ONETOUCH DELICA LANCETS 33G MISC: 5 refills | Status: AC

## 2023-08-22 MED ORDER — ONETOUCH VERIO W/DEVICE KIT: PACK | 0 refills | Status: AC

## 2023-08-22 MED ORDER — ONETOUCH VERIO VI STRP: ORAL_STRIP | 12 refills | Status: AC

## 2023-08-22 NOTE — Addendum Note (Signed)
 Addended by: Lisabeth Pick on: 08/22/2023 11:28 AM   Modules accepted: Orders

## 2023-08-23 ENCOUNTER — Encounter: Payer: Self-pay | Admitting: Skilled Nursing Facility1

## 2023-08-23 ENCOUNTER — Encounter: Payer: 59 | Attending: Internal Medicine | Admitting: Skilled Nursing Facility1

## 2023-08-23 DIAGNOSIS — E1165 Type 2 diabetes mellitus with hyperglycemia: Secondary | ICD-10-CM | POA: Diagnosis present

## 2023-08-23 NOTE — Progress Notes (Signed)
 DM medications: Metformin 500 mg 4 tabs daily- not taking  Glipizide 10 mg  XL daily  Jardiance 25 mg daily Rybelsus 7 mg daily     Pt states he has not had much alcohol and seems he is losing his taste for it.  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 Pt states he walks 45 minutes-60 minutes 4 days a week. Pt states he cooks for himself but his girl or mom will cook for him sometimes.    Diabetes Self-Management Education  Visit Type: First/Initial  Appt. Start Time: 3:05  Appt. End Time: 4:05  08/29/2023  [name], identified by name and date of birth, is a 53 y.o. male with a diagnosis of Diabetes: Type 2.   ASSESSMENT  There were no vitals taken for this visit. There is no height or weight on file to calculate BMI.   Diabetes Self-Management Education - 08/23/23 1508       Visit Information   Visit Type First/Initial      Initial Visit   Diabetes Type Type 2    Are you currently following a meal plan? No    Are you taking your medications as prescribed? Yes      Health Coping   How would you rate your overall health? Good      Psychosocial Assessment   Patient Belief/Attitude about Diabetes Motivated to manage diabetes    What is the hardest part about your diabetes right now, causing you the most concern, or is the most worrisome to you about your diabetes?   Making healty food and beverage choices    Self-management support Family;Friends    Patient Concerns Nutrition/Meal planning    Special Needs None    Learning Readiness Contemplating    How often do you need to have someone help you when you read instructions, pamphlets, or other written materials from your doctor or pharmacy? 1 - Never      Pre-Education Assessment   Patient understands the diabetes disease and treatment process. Needs Instruction    Patient understands incorporating nutritional management into lifestyle. Needs Instruction    Patient undertands incorporating physical activity into  lifestyle. Needs Instruction    Patient understands using medications safely. Needs Instruction    Patient understands monitoring blood glucose, interpreting and using results Needs Instruction    Patient understands prevention, detection, and treatment of acute complications. Needs Instruction    Patient understands prevention, detection, and treatment of chronic complications. Needs Instruction    Patient understands how to develop strategies to address psychosocial issues. Needs Instruction    Patient understands how to develop strategies to promote health/change behavior. Needs Instruction      Complications   Last HgB A1C per patient/outside source 11.7 %      Dietary Intake   Breakfast cereal + splenda    Lunch chinese    Beverage(s) 3 zero soda, water      Activity / Exercise   Activity / Exercise Type Light (walking / raking leaves)    How many days per week do you exercise? 4    How many minutes per day do you exercise? 45    Total minutes per week of exercise 180      Patient Education   Previous Diabetes Education No    Disease Pathophysiology Definition of diabetes, type 1 and 2, and the diagnosis of diabetes;Factors that contribute to the development of diabetes    Healthy Eating Role of diet in the  treatment of diabetes and the relationship between the three main macronutrients and blood glucose level;Food label reading, portion sizes and measuring food.;Plate Method;Effects of alcohol on blood glucose and safety factors with consumption of alcohol.;Information on hints to eating out and maintain blood glucose control.    Being Active Role of exercise on diabetes management, blood pressure control and cardiac health.;Helped patient identify appropriate exercises in relation to his/her diabetes, diabetes complications and other health issue.    Medications Reviewed patients medication for diabetes, action, purpose, timing of dose and side effects.    Monitoring  Taught/evaluated SMBG meter.;Daily foot exams;Yearly dilated eye exam;Identified appropriate SMBG and/or A1C goals.;Interpreting lab values - A1C, lipid, urine microalbumina.    Acute complications Taught prevention, symptoms, and  treatment of hypoglycemia - the 15 rule.;Discussed and identified patients' prevention, symptoms, and treatment of hyperglycemia.    Chronic complications Dental care;Retinopathy and reason for yearly dilated eye exams;Nephropathy, what it is, prevention of, the use of ACE, ARB's and early detection of through urine microalbumia.;Assessed and discussed foot care and prevention of foot problems;Lipid levels, blood glucose control and heart disease;Relationship between chronic complications and blood glucose control    Diabetes Stress and Support Role of stress on diabetes      Individualized Goals (developed by patient)   Nutrition General guidelines for healthy choices and portions discussed;Follow meal plan discussed    Physical Activity Exercise 5-7 days per week;60 minutes per day    Medications take my medication as prescribed    Problem Solving Eating Pattern      Post-Education Assessment   Patient understands the diabetes disease and treatment process. Demonstrates understanding / competency    Patient understands incorporating nutritional management into lifestyle. Demonstrates understanding / competency    Patient undertands incorporating physical activity into lifestyle. Demonstrates understanding / competency    Patient understands using medications safely. Demonstrates understanding / competency    Patient understands monitoring blood glucose, interpreting and using results Demonstrates understanding / competency    Patient understands prevention, detection, and treatment of acute complications. Demonstrates understanding / competency    Patient understands prevention, detection, and treatment of chronic complications. Demonstrates understanding / competency     Patient understands how to develop strategies to address psychosocial issues. Demonstrates understanding / competency    Patient understands how to develop strategies to promote health/change behavior. Demonstrates understanding / competency      Outcomes   Expected Outcomes Demonstrated interest in learning. Expect positive outcomes    Future DMSE 4-6 wks    Program Status Completed             Individualized Plan for Diabetes Self-Management Training:   Learning Objective:  Patient will have a greater understanding of diabetes self-management. Patient education plan is to attend individual and/or group sessions per assessed needs and concerns.    Expected Outcomes:  Demonstrated interest in learning. Expect positive outcomes  Education material provided: ADA - How to Thrive: A Guide for Your Journey with Diabetes, Meal plan card, My Plate, and Snack sheet  If problems or questions, patient to contact team via:  Phone and Email  Future DSME appointment: 4-6 wks

## 2023-09-09 LAB — LAB REPORT - SCANNED: EGFR: 88

## 2023-10-02 ENCOUNTER — Encounter: Payer: Self-pay | Admitting: Family Medicine

## 2023-10-04 ENCOUNTER — Ambulatory Visit: Payer: 59 | Admitting: Internal Medicine

## 2023-10-18 ENCOUNTER — Ambulatory Visit: Admitting: Skilled Nursing Facility1

## 2023-12-06 ENCOUNTER — Encounter: Payer: Self-pay | Admitting: Internal Medicine

## 2023-12-06 ENCOUNTER — Ambulatory Visit: Admitting: Internal Medicine

## 2023-12-06 VITALS — BP 126/84 | HR 85 | Ht 66.5 in | Wt 199.0 lb

## 2023-12-06 DIAGNOSIS — E1165 Type 2 diabetes mellitus with hyperglycemia: Secondary | ICD-10-CM

## 2023-12-06 DIAGNOSIS — Z794 Long term (current) use of insulin: Secondary | ICD-10-CM

## 2023-12-06 DIAGNOSIS — N529 Male erectile dysfunction, unspecified: Secondary | ICD-10-CM

## 2023-12-06 DIAGNOSIS — Z7984 Long term (current) use of oral hypoglycemic drugs: Secondary | ICD-10-CM

## 2023-12-06 DIAGNOSIS — E785 Hyperlipidemia, unspecified: Secondary | ICD-10-CM

## 2023-12-06 LAB — POCT GLUCOSE (DEVICE FOR HOME USE): POC Glucose: 437 mg/dL — AB (ref 70–99)

## 2023-12-06 LAB — POCT GLYCOSYLATED HEMOGLOBIN (HGB A1C): Hemoglobin A1C: 11.8 % — AB (ref 4.0–5.6)

## 2023-12-06 MED ORDER — INSULIN PEN NEEDLE 32G X 4 MM MISC: 1.0000 | Freq: Every day | 3 refills | Status: AC

## 2023-12-06 MED ORDER — LANTUS SOLOSTAR 100 UNIT/ML ~~LOC~~ SOPN: 14.0000 [IU] | PEN_INJECTOR | Freq: Every day | SUBCUTANEOUS | 3 refills | Status: AC

## 2023-12-06 MED ORDER — DEXCOM G7 SENSOR MISC: 1.0000 | 3 refills | Status: AC

## 2023-12-06 NOTE — Progress Notes (Unsigned)
 Name: Kirk Cox  MRN/ DOB: [id number], [birth year]   Age/ Sex: 53 y.o., male    PCP: Kip Righter, MD   Reason for Endocrinology Evaluation: Type 2 Diabetes Mellitus     Date of Initial Endocrinology Visit: 12/28/2020    PATIENT IDENTIFIER: [name] is a 53 y.o. male with a past medical history of T2DM, HTN and Dyslipidemia. The patient presented for initial endocrinology clinic visit on 12/28/2020 for consultative assistance with his diabetes management.    HPI: [name] was    Diagnosed with DM 2019 Hemoglobin A1c has ranged from 6.7% in 02/2020, peaking at 12.2% in 2022.  On his initial visit to our clinic his A1c was 11.3%, he was on glipizide  XL, metformin , and Jardiance     We switched pravastatin to atorvastatin  07/2021 ( LDL 94 at the time )   Switch Glipizide  to XL because he would forget the 2nd dose   Started Rybelsus  02/2023  Patient was not taking metformin  by 07/2023 and we discontinued it  SUBJECTIVE:   During the last visit (07/05/2023): A1c 11.7 %     Today (12/06/23): [name] is here for follow-up on diabetes management.He is accompanied  by his girlfriend . He checks his blood sugars 2 x daily  .   Denies nausea or vomiting  Denies constipation or diarrhea  Patient is complaining of decreased libido and erectile dysfunction for a while He did not make it to our CDE in February, 2025   HOME DIABETES REGIMEN: Glipizide  10 mg  XL, 2 TABS  daily  Jardiance  25 mg daily Rybelsus  14 mg daily   Atorvastatin  20 mg daily   Statin: yes ACE-I/ARB: yes Prior Diabetic Education: yes    METER DOWNLOAD SUMMARY: did not bring    DIABETIC COMPLICATIONS: Microvascular complications:    Denies: CKD, retinopathy, neuropathy  Last eye exam: Completed 05/2022  Macrovascular complications:   Denies: CAD, PVD, CVA   PAST HISTORY: Past Medical History:  Past Medical History:  Diagnosis Date   Blood in stool    pt noticed this 3 weks  ago from 03-03-2021   Diabetes mellitus without complication (HCC)    Hyperlipidemia    Hypertension    Past Surgical History:  Past Surgical History:  Procedure Laterality Date   CHOLECYSTECTOMY N/A 08/21/2012   Procedure: LAPAROSCOPIC CHOLECYSTECTOMY WITH INTRAOPERATIVE CHOLANGIOGRAM;  Surgeon: Donnice POUR. Belinda, MD;  Location: [facility];  Service: General;  Laterality: N/A;   COLONOSCOPY  09/23/2010   normal   HERNIA REPAIR     UMBILICAL HERNIA REPAIR  08/21/2012   Procedure: HERNIA REPAIR UMBILICAL ADULT;  Surgeon: Donnice POUR. Belinda, MD;  Location: [facility];  Service: General;;    Social History:  reports that he quit smoking about 5 years ago. His smoking use included cigarettes. He has never used smokeless tobacco. He reports current alcohol use. He reports that he does not use drugs. Family History:  Family History  Problem Relation Age of Onset   Diabetes Mother    Diabetes Father    Colon cancer Neg Hx    Colon polyps Neg Hx    Esophageal cancer Neg Hx    Rectal cancer Neg Hx    Stomach cancer Neg Hx      HOME MEDICATIONS: Allergies as of 12/06/2023   No Known Allergies      Medication List        Accurate as of December 06, 2023  2:00 PM. If you have any  questions, ask your nurse or doctor.          acetaminophen  325 MG tablet Commonly known as: Tylenol  Take 2 tablets (650 mg total) by mouth every 6 (six) hours as needed for moderate pain.   clotrimazole-betamethasone  cream Commonly known as: LOTRISONE Apply topically daily as needed.   cyclobenzaprine  5 MG tablet Commonly known as: FLEXERIL  Take 1 tablet (5 mg total) by mouth at bedtime as needed for muscle spasms.   Dexcom G7 Sensor Misc 1 Device by Does not apply route as directed.   Dilaudid  2 MG tablet Generic drug: HYDROmorphone  Take 1 tablet every 4-6 hours by oral route as needed for pain for 7 days.   empagliflozin  25 MG Tabs tablet Commonly known as: Jardiance  Take 1 tablet (25 mg total) by  mouth daily before breakfast.   glipiZIDE  10 MG 24 hr tablet Commonly known as: glipiZIDE  XL Take 2 tablets (20 mg total) by mouth daily with breakfast.   glucose blood test strip OneTouch Verio test strips  USE TO CHECK EVERY MORNING,ALTERNATING BETWEEN MORNING AND EVENING MEALS 2 3 TIMES A DAY   OneTouch Verio test strip Generic drug: glucose blood Check blood sugars 1-3 times daily   ibuprofen  800 MG tablet Commonly known as: ADVIL  Take 800 mg by mouth every 6 (six) hours as needed.   lisinopril 5 MG tablet Commonly known as: ZESTRIL Take 5 mg by mouth daily.   LORazepam 1 MG tablet Commonly known as: ATIVAN Take 1 mg by mouth daily as needed.   OneTouch Delica Plus Lancet33G Misc Apply topically daily.   OneTouch Delica Lancets 33G Misc Check blood sugar 1-3 times daily   OneTouch Verio w/Device Kit Check blood sugar 1-3 times daily Dx E11.65   oxyCODONE -acetaminophen  10-325 MG tablet Commonly known as: PERCOCET TAKE 1 TABLET BY MOUTH EVERY 6 HOURS AS DIRECTED FOR 7 DAYS   rosuvastatin  20 MG tablet Commonly known as: CRESTOR  Take 1 tablet (20 mg total) by mouth daily.   Rybelsus  14 MG Tabs Generic drug: Semaglutide  Take 1 tablet (14 mg total) by mouth daily.   sildenafil 50 MG tablet Commonly known as: VIAGRA Take 50 mg by mouth daily as needed.   valACYclovir  1000 MG tablet Commonly known as: VALTREX          ALLERGIES: No Known Allergies   OBJECTIVE:   VITAL SIGNS: BP 126/84 (BP Location: Left Arm, Patient Position: Sitting, Cuff Size: Normal)   Pulse 85   Ht 5' 6.5 (1.689 m)   Wt 199 lb (90.3 kg)   SpO2 99%   BMI 31.64 kg/m    PHYSICAL EXAM:  General: Pt appears well and is in NAD  Lungs: Clear with good BS bilat   Heart: RRR   Abdomen: Normoactive bowel sounds, soft, nontender  Extremities:  Lower extremities - No pretibial edema.   Neuro: MS is good with appropriate affect, pt is alert and Ox3    DM foot exam: 03/01/2023  The  skin of the feet is intact without sores or ulcerations. The pedal pulses are 2+ on right and 2+ on left. The sensation is intact to a screening 5.07, 10 gram monofilament bilaterally    DATA REVIEWED: 05/09/2023 BUN 14 CR 1.09 GFR 89 HDL 48 LDL 150 TG 129   Old records , labs and images have been reviewed.    ASSESSMENT / PLAN / RECOMMENDATIONS:   1) Type 2 Diabetes Mellitus, Poorly controlled, Without complications - Most recent A1c of 11.8%. Goal A1c <  7.0 %.    -A1c continues to be above goal -We discontinued metformin  because he was not taking it due to bloating sensation - He has not received the Dexcom, a new prescription was sent today - I have recommended insulin , patient agreed this time to start on the insulin , he was educated by my CMA on proper injection technique - I will decrease glipizide  as below, preemptively to prevent hypoglycemia   MEDICATIONS:  Continue Jardiance  25 mg, 1 tablet in the morning  Decrease glipizide  10 mg XL, 1 tablet before Breakfast  Continue Rybelsus  14 mg daily Start Lantus  14 units daily  EDUCATION / INSTRUCTIONS: BG monitoring instructions: Patient is instructed to check his blood sugars 1 times a day, fasting. Call [facility] if: BG persistently < 70  I reviewed the Rule of 15 for the treatment of hypoglycemia in detail with the patient. Literature supplied.   2) Diabetic complications:  Eye: Does not have known diabetic retinopathy.  Neuro/ Feet: Does not have known diabetic peripheral neuropathy. Renal: Patient does not have known baseline CKD. He is not on an ACEI/ARB at present.   3) Dyslipidemia:  -His LDL continues to be above goal -I have  pravastatin 40 mg to rosuvastatin  20 mg, in 2023  Medication Take rosuvastatin  20 mg daily  4) Erectile Dysfunction:  - Multifactorial given uncontrolled chronic medical conditions including DM and dyslipidemia - If symptoms do not improve we will check  testosterone and prolactin   Follow-up in 3 months   Signed electronically by: Stefano Redgie Butts, MD  [facility]  [facility] [address] [place], [place] [address] Phone: [phone] FAX: [phone]   CC: Kip Righter, MD [address] [place] [address] Phone: [phone]  Fax: [phone]    Return to Endocrinology clinic as below: No future appointments.

## 2023-12-06 NOTE — Patient Instructions (Addendum)
-  Continue  Rybelsus  14 mg daily  -Decrease Glipizide  10 mg XL, 1 tablet before Breakfast  -Continue  Jardiance  25 mg, 1 tablet in the morning  - Start Lantus  14 units once daily      HOW TO TREAT LOW BLOOD SUGARS (Blood sugar LESS THAN 70 MG/DL) Please follow the RULE OF 15 for the treatment of hypoglycemia treatment (when your (blood sugars are less than 70 mg/dL)   STEP 1: Take 15 grams of carbohydrates when your blood sugar is low, which includes:  3-4 GLUCOSE TABS  OR 3-4 OZ OF JUICE OR REGULAR SODA OR ONE TUBE OF GLUCOSE GEL    STEP 2: RECHECK blood sugar in 15 MINUTES STEP 3: If your blood sugar is still low at the 15 minute recheck --> then, go back to STEP 1 and treat AGAIN with another 15 grams of carbohydrates.

## 2023-12-07 DIAGNOSIS — N529 Male erectile dysfunction, unspecified: Secondary | ICD-10-CM | POA: Insufficient documentation

## 2023-12-12 ENCOUNTER — Other Ambulatory Visit (HOSPITAL_COMMUNITY): Payer: Self-pay

## 2023-12-12 ENCOUNTER — Telehealth: Payer: Self-pay | Admitting: Pharmacy Technician

## 2023-12-12 NOTE — Telephone Encounter (Signed)
 Pharmacy Patient Advocate Encounter  Received notification from [employer] that Prior Authorization for Dexcom G7 Sensor has been APPROVED from 12/12/23 to 12/11/24. Ran test claim, Copay is $50.00. This test claim was processed through [facility]- copay amounts may vary at other pharmacies due to pharmacy/plan contracts, or as the patient moves through the different stages of their insurance plan.   PA #/Case ID/Reference #: [id number]

## 2023-12-12 NOTE — Telephone Encounter (Signed)
 Pharmacy Patient Advocate Encounter   Received notification from CoverMyMeds that prior authorization for Dexcom G7 Sensor is required/requested.   Insurance verification completed.   The patient is insured through [facility] .   Per test claim: PA required; PA submitted to above mentioned insurance via CoverMyMeds Key/confirmation #/EOC [id number] Status is pending

## 2023-12-21 ENCOUNTER — Telehealth: Payer: Self-pay

## 2023-12-21 NOTE — Telephone Encounter (Signed)
 Spoke with Medical Director Dr. Maude Morning from [employer] concerning patient's diabetes and plan. Explained that patient was last seen by MD on 12/06/23.  Patient was referred to diabetic educator in 07/2023, however reviewing status reason it seems as if patient refused.   Since then his A1c has increased slightly and Medical director is requesting we try once more as he feels patient may be a little bit more receptive based on the notes written by their CM.   Will Route this back to the diabetic educator to ask her to retry to schedule.

## 2023-12-21 NOTE — Telephone Encounter (Signed)
 MD left message requesting to speak with peer regarding patient's  A1C. Requested callback at [phone]. Message routed to MD.

## 2023-12-22 ENCOUNTER — Telehealth: Payer: Self-pay

## 2023-12-22 NOTE — Telephone Encounter (Signed)
 Call placed to f/u with MD's request for patient to be seen by diabetic educator. MD made aware that patient refused again. Per MD he will have case manager add this to her notes.

## 2024-03-13 ENCOUNTER — Ambulatory Visit: Admitting: Internal Medicine

## 2024-03-27 ENCOUNTER — Encounter: Payer: Self-pay | Admitting: Internal Medicine

## 2024-03-27 ENCOUNTER — Ambulatory Visit: Admitting: Internal Medicine

## 2024-03-27 NOTE — Progress Notes (Deleted)
 Name: Kirk Cox  MRN/ DOB: [id number], Aug 15, 1970   Age/ Sex: 53 y.o., male    PCP: Kip Righter, MD   Reason for Endocrinology Evaluation: Type 2 Diabetes Mellitus     Date of Initial Endocrinology Visit: 12/28/2020    PATIENT IDENTIFIER: Kirk Cox is a 53 y.o. male with a past medical history of T2DM, HTN and Dyslipidemia. The patient presented for initial endocrinology clinic visit on 12/28/2020 for consultative assistance with his diabetes management.    HPI: Kirk Cox was    Diagnosed with DM 2019 Hemoglobin A1c has ranged from 6.7% in 02/2020, peaking at 12.2% in 2022.  On his initial visit to our clinic his A1c was 11.3%, he was on glipizide  XL, metformin , and Jardiance     We switched pravastatin to atorvastatin  07/2021 ( LDL 94 at the time )   Switch Glipizide  to XL because he would forget the 2nd dose   Started Rybelsus  02/2023  Patient was not taking metformin  by 07/2023 and we discontinued it  SUBJECTIVE:   During the last visit (07/05/2023): A1c 11.7 %     Today (03/27/24): Kirk Cox is here for follow-up on diabetes management.He is accompanied  by his girlfriend . He checks his blood sugars 2 x daily  .   Denies nausea or vomiting  Denies constipation or diarrhea  Patient is complaining of decreased libido and erectile dysfunction for a while He did not make it to our CDE in February, 2025   HOME DIABETES REGIMEN: Glipizide  10 mg  XL, 2 TABS  daily  Jardiance  25 mg daily Rybelsus  14 mg daily   Atorvastatin  20 mg daily      Statin: yes ACE-I/ARB: yes Prior Diabetic Education: yes    METER DOWNLOAD SUMMARY: did not bring    DIABETIC COMPLICATIONS: Microvascular complications:    Denies: CKD, retinopathy, neuropathy  Last eye exam: Completed 05/2022  Macrovascular complications:   Denies: CAD, PVD, CVA   PAST HISTORY: Past Medical History:  Past Medical History:  Diagnosis Date   Blood in stool    pt noticed this 3  weks ago from 03-03-2021   Diabetes mellitus without complication (HCC)    Hyperlipidemia    Hypertension    Past Surgical History:  Past Surgical History:  Procedure Laterality Date   CHOLECYSTECTOMY N/A 08/21/2012   Procedure: LAPAROSCOPIC CHOLECYSTECTOMY WITH INTRAOPERATIVE CHOLANGIOGRAM;  Surgeon: Donnice POUR. Belinda, MD;  Location: [facility];  Service: General;  Laterality: N/A;   COLONOSCOPY  09/23/2010   normal   HERNIA REPAIR     UMBILICAL HERNIA REPAIR  08/21/2012   Procedure: HERNIA REPAIR UMBILICAL ADULT;  Surgeon: Donnice POUR. Belinda, MD;  Location: [facility];  Service: General;;    Social History:  reports that he quit smoking about 5 years ago. His smoking use included cigarettes. He has never used smokeless tobacco. He reports current alcohol use. He reports that he does not use drugs. Family History:  Family History  Problem Relation Age of Onset   Diabetes Mother    Diabetes Father    Colon cancer Neg Hx    Colon polyps Neg Hx    Esophageal cancer Neg Hx    Rectal cancer Neg Hx    Stomach cancer Neg Hx      HOME MEDICATIONS: Allergies as of 03/27/2024   No Known Allergies      Medication List        Accurate as of March 27, 2024  7:34 AM. If  you have any questions, ask your nurse or doctor.          acetaminophen  325 MG tablet Commonly known as: Tylenol  Take 2 tablets (650 mg total) by mouth every 6 (six) hours as needed for moderate pain.   clotrimazole-betamethasone  cream Commonly known as: LOTRISONE Apply topically daily as needed.   cyclobenzaprine  5 MG tablet Commonly known as: FLEXERIL  Take 1 tablet (5 mg total) by mouth at bedtime as needed for muscle spasms.   Dexcom G7 Sensor Misc 1 Device by Does not apply route as directed.   Dilaudid  2 MG tablet Generic drug: HYDROmorphone  Take 1 tablet every 4-6 hours by oral route as needed for pain for 7 days.   empagliflozin  25 MG Tabs tablet Commonly known as: Jardiance  Take 1 tablet (25 mg  total) by mouth daily before breakfast.   glipiZIDE  10 MG 24 hr tablet Commonly known as: glipiZIDE  XL Take 2 tablets (20 mg total) by mouth daily with breakfast.   glucose blood test strip OneTouch Verio test strips  USE TO CHECK EVERY MORNING,ALTERNATING BETWEEN MORNING AND EVENING MEALS 2 3 TIMES A DAY   OneTouch Verio test strip Generic drug: glucose blood Check blood sugars 1-3 times daily   ibuprofen  800 MG tablet Commonly known as: ADVIL  Take 800 mg by mouth every 6 (six) hours as needed.   Insulin  Pen Needle 32G X 4 MM Misc 1 Device by Does not apply route daily in the afternoon.   Lantus  SoloStar 100 UNIT/ML Solostar Pen Generic drug: insulin  glargine Inject 14 Units into the skin daily.   lisinopril 5 MG tablet Commonly known as: ZESTRIL Take 5 mg by mouth daily.   LORazepam 1 MG tablet Commonly known as: ATIVAN Take 1 mg by mouth daily as needed.   OneTouch Delica Plus Lancet33G Misc Apply topically daily.   OneTouch Delica Lancets 33G Misc Check blood sugar 1-3 times daily   OneTouch Verio w/Device Kit Check blood sugar 1-3 times daily Dx E11.65   oxyCODONE -acetaminophen  10-325 MG tablet Commonly known as: PERCOCET TAKE 1 TABLET BY MOUTH EVERY 6 HOURS AS DIRECTED FOR 7 DAYS   rosuvastatin  20 MG tablet Commonly known as: CRESTOR  Take 1 tablet (20 mg total) by mouth daily.   Rybelsus  14 MG Tabs Generic drug: Semaglutide  Take 1 tablet (14 mg total) by mouth daily.   sildenafil 50 MG tablet Commonly known as: VIAGRA Take 50 mg by mouth daily as needed.   valACYclovir  1000 MG tablet Commonly known as: VALTREX          ALLERGIES: No Known Allergies   OBJECTIVE:   VITAL SIGNS: There were no vitals taken for this visit.   PHYSICAL EXAM:  General: Pt appears well and is in NAD  Lungs: Clear with good BS bilat   Heart: RRR   Abdomen: Normoactive bowel sounds, soft, nontender  Extremities:  Lower extremities - No pretibial edema.    Neuro: MS is good with appropriate affect, pt is alert and Ox3    DM foot exam: 03/01/2023  The skin of the feet is intact without sores or ulcerations. The pedal pulses are 2+ on right and 2+ on left. The sensation is intact to a screening 5.07, 10 gram monofilament bilaterally    DATA REVIEWED: 05/09/2023 BUN 14 CR 1.09 GFR 89 HDL 48 LDL 150 TG 129   Old records , labs and images have been reviewed.    ASSESSMENT / PLAN / RECOMMENDATIONS:   1) Type 2 Diabetes Mellitus, Poorly  controlled, Without complications - Most recent A1c of 11.8%. Goal A1c < 7.0 %.    -A1c continues to be above goal -We discontinued metformin  because he was not taking it due to bloating sensation - He has not received the Dexcom, a new prescription was sent today - I have recommended insulin , patient agreed this time to start on the insulin , he was educated by my CMA on proper injection technique - I will decrease glipizide  as below, preemptively to prevent hypoglycemia   MEDICATIONS:  Continue Jardiance  25 mg, 1 tablet in the morning  Decrease glipizide  10 mg XL, 1 tablet before Breakfast  Continue Rybelsus  14 mg daily Start Lantus  14 units daily  EDUCATION / INSTRUCTIONS: BG monitoring instructions: Patient is instructed to check his blood sugars 1 times a day, fasting. Call [facility] if: BG persistently < 70  I reviewed the Rule of 15 for the treatment of hypoglycemia in detail with the patient. Literature supplied.   2) Diabetic complications:  Eye: Does not have known diabetic retinopathy.  Neuro/ Feet: Does not have known diabetic peripheral neuropathy. Renal: Patient does not have known baseline CKD. He is not on an ACEI/ARB at present.   3) Dyslipidemia:  -His LDL continues to be above goal -I have  pravastatin 40 mg to rosuvastatin  20 mg, in 2023  Medication Take rosuvastatin  20 mg daily  4) Erectile Dysfunction:  - Multifactorial given uncontrolled  chronic medical conditions including DM and dyslipidemia - If symptoms do not improve we will check testosterone and prolactin   Follow-up in 3 months   Signed electronically by: Stefano Redgie Butts, MD  [facility]  [facility] [address] [place] [place], [place] [address] Phone: [phone] FAX: [phone]   CC: Kip Righter, MD [address] [place] [address] Phone: [phone]  Fax: [phone]    Return to Endocrinology clinic as below: Future Appointments  Date Time Provider Department Center  03/27/2024 11:50 AM Shelagh Rayman, Donell Redgie, MD [id number] None

## 2024-04-17 ENCOUNTER — Ambulatory Visit: Admitting: Internal Medicine

## 2024-05-15 LAB — LAB REPORT - SCANNED: EGFR: 85

## 2024-06-21 ENCOUNTER — Ambulatory Visit
Admission: EM | Admit: 2024-06-21 | Discharge: 2024-06-21 | Disposition: A | Discharge status: Home / Self Care | Attending: Family Medicine | Admitting: Family Medicine

## 2024-06-21 DIAGNOSIS — L03019 Cellulitis of unspecified finger: Secondary | ICD-10-CM

## 2024-06-21 MED ORDER — AMOXICILLIN-POT CLAVULANATE 875-125 MG PO TABS: 1.0000 | ORAL_TABLET | Freq: Two times a day (BID) | ORAL | 0 refills | Status: AC

## 2024-06-21 MED ORDER — NAPROXEN 375 MG PO TABS: 375.0000 mg | ORAL_TABLET | Freq: Two times a day (BID) | ORAL | 0 refills | Status: AC | PRN

## 2024-06-21 NOTE — ED Provider Notes (Signed)
 " [facility]    CSN: [id number] Arrival date & time: 06/21/24  1809      History   Chief Complaint Chief Complaint  Patient presents with   Hand Pain    HPI Kirk Cox is a 54 y.o. male presents for finger pain.  Patient reports yesterday he developed pain with swelling to his right distal third finger.  No drainage, fevers.  No injury.  He does state he bites his nails.  No OTC treatments have been used.  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 Belinda, MD;  Location: [facility];  Service: General;  Laterality: N/A;   COLONOSCOPY  09/23/2010   normal   HERNIA REPAIR     UMBILICAL HERNIA REPAIR  08/21/2012   Procedure: HERNIA REPAIR UMBILICAL ADULT;  Surgeon: Donnice POUR. Belinda, MD;  Location: [facility];  Service: General;;       Home Medications    Prior to Admission medications  Medication Sig Start Date End Date Taking? Authorizing Provider  amoxicillin-clavulanate (AUGMENTIN) 875-125 MG tablet Take 1 tablet by mouth every 12 (twelve) hours. 06/21/24  Yes Melchizedek Espinola, Jodi R, NP  naproxen (NAPROSYN) 375 MG tablet Take 1 tablet (375 mg total) by mouth 2 (two) times daily as needed for moderate pain (pain  score 4-6). 06/21/24  Yes Makenze Ellett, Jodi R, NP  acetaminophen  (TYLENOL ) 325 MG tablet Take 2 tablets (650 mg total) by mouth every 6 (six) hours as needed for moderate pain. 03/03/22   Christopher Savannah, PA-C  Blood Glucose Monitoring Suppl [facility] VERIO) w/Device KIT Check blood sugar 1-3 times daily Dx E11.65 08/22/23   Shamleffer, Ibtehal Jaralla, MD  clotrimazole-betamethasone  (LOTRISONE) cream Apply topically daily as needed. 12/07/20   [provider]  Continuous Glucose Sensor (DEXCOM G7 SENSOR) MISC 1 Device by Does not apply route as directed. 12/06/23   Shamleffer, Ibtehal Jaralla, MD  cyclobenzaprine  (FLEXERIL ) 5 MG tablet Take 1 tablet (5 mg total) by mouth at bedtime as needed for muscle spasms. Patient not taking: Reported on 12/06/2023 03/03/22   Christopher Savannah, PA-C  empagliflozin  (JARDIANCE ) 25 MG TABS tablet Take 1 tablet (25 mg total) by mouth daily before breakfast. 07/05/23   Shamleffer, Ibtehal Jaralla, MD  glipiZIDE  (GLIPIZIDE  XL) 10 MG 24 hr tablet Take 2 tablets (20 mg total) by mouth daily with breakfast. 07/05/23   Shamleffer, Ibtehal Jaralla, MD  glucose blood (ONETOUCH VERIO) test strip Check blood sugars 1-3 times daily 08/22/23   Shamleffer, Ibtehal Jaralla, MD  glucose blood test strip OneTouch Verio test strips  USE TO CHECK EVERY MORNING,ALTERNATING BETWEEN MORNING AND EVENING MEALS 2 3 TIMES A DAY    [provider]  HYDROmorphone  (DILAUDID ) 2 MG tablet Take  1 tablet every 4-6 hours by oral route as needed for pain for 7 days. Patient not taking: Reported on 12/06/2023 07/27/22   [provider]  insulin  glargine (LANTUS  SOLOSTAR) 100 UNIT/ML Solostar Pen Inject 14 Units into the skin daily. 12/06/23   Shamleffer, Ibtehal Jaralla, MD  Insulin  Pen Needle 32G X 4 MM MISC 1 Device by Does not apply route daily in the afternoon. 12/06/23   Shamleffer, Ibtehal Jaralla, MD  Lancets [facility] DELICA PLUS [place]) MISC Apply topically daily. 01/08/21   [provider]   lisinopril (PRINIVIL,ZESTRIL) 5 MG tablet Take 5 mg by mouth daily.    [provider]  LORazepam (ATIVAN) 1 MG tablet Take 1 mg by mouth daily as needed. 02/15/21   [provider]  OneTouch Delica Lancets 33G MISC Check blood sugar 1-3 times daily 08/22/23   Shamleffer, Donell Cardinal, MD  oxyCODONE -acetaminophen  (PERCOCET) 10-325 MG tablet TAKE 1 TABLET BY MOUTH EVERY 6 HOURS AS DIRECTED FOR 7 DAYS Patient not taking: Reported on 12/06/2023    [provider]  rosuvastatin  (CRESTOR ) 20 MG tablet Take 1 tablet (20 mg total) by mouth daily. 07/05/23   Shamleffer, Ibtehal Jaralla, MD  Semaglutide  (RYBELSUS ) 14 MG TABS Take 1 tablet (14 mg total) by mouth daily. 07/05/23   Shamleffer, Ibtehal Jaralla, MD  sildenafil (VIAGRA) 50 MG tablet Take 50 mg by mouth daily as needed. 06/04/19   [provider]  valACYclovir  (VALTREX ) 1000 MG tablet     [provider]    Family History Family History  Problem Relation Age of Onset   Diabetes Mother    Diabetes Father    Colon cancer Neg Hx    Colon polyps Neg Hx    Esophageal cancer Neg Hx    Rectal cancer Neg Hx    Stomach cancer Neg Hx     Social History Social History[1]   Allergies   Patient has no known allergies.   Review of Systems Review of Systems  Musculoskeletal:        Right third finger pain     Physical Exam Triage Vital Signs ED Triage Vitals  Encounter Vitals Group     BP 06/21/24 1820 124/82     Girls Systolic BP Percentile --      Girls Diastolic BP Percentile --      Boys Systolic BP Percentile --      Boys Diastolic BP Percentile --      Pulse Rate 06/21/24 1820 78     Resp 06/21/24 1820 16     Temp 06/21/24 1820 99.2 F (37.3 C)     Temp Source 06/21/24 1820 Oral     SpO2 06/21/24 1820 95 %     Weight --      Height --      Head Circumference --      Peak Flow --      Pain Score 06/21/24 1819 10     Pain Loc --      Pain Education --      Exclude from Growth  Chart --    No data found.  Updated Vital Signs BP 124/82 (BP Location: Left Arm)   Pulse 78   Temp 99.2 F (37.3 C) (Oral)   Resp 16   SpO2 95%   Visual Acuity Right Eye Distance:   Left Eye Distance:   Bilateral Distance:    Right Eye Near:   Left Eye Near:    Bilateral Near:  Physical Exam Vitals and nursing note reviewed.  Constitutional:      General: He is not in acute distress.    Appearance: Normal appearance. He is not ill-appearing.  HENT:     Head: Normocephalic and atraumatic.  Eyes:     Pupils: Pupils are equal, round, and reactive to light.  Cardiovascular:     Rate and Rhythm: Normal rate.  Pulmonary:     Effort: Pulmonary effort is normal.  Musculoskeletal:       Hands:     Comments: There is mild swelling and erythema to the dorsal aspect of the medial right third distal finger adjacent to the nail.  Indurated without fluctuance.  No active drainage.  Cap refill +2.  Area is tender to palpation  Skin:    General: Skin is warm and dry.  Neurological:     General: No focal deficit present.     Mental Status: He is alert and oriented to person, place, and time.  Psychiatric:        Mood and Affect: Mood normal.        Behavior: Behavior normal.      UC Treatments / Results  Labs (all labs ordered are listed, but only abnormal results are displayed) Labs Reviewed - No data to display  EKG   Radiology No results found.  Procedures Procedures (including critical care time)  Medications Ordered in UC Medications - No data to display  Initial Impression / Assessment and Plan / UC Course  I have reviewed the triage vital signs and the nursing notes.  Pertinent labs & imaging results that were available during my care of the patient were reviewed by me and considered in my medical decision making (see chart for details).     Reviewed exam and symptoms with patient.  Discussed paronychia.  Will do Augmentin and naproxen.  Discussed  warm Epsom salt soaks.  Avoid biting nails.  PCP follow-up if symptoms do not improve.  ER precautions reviewed. Final Clinical Impressions(s) / UC Diagnoses   Final diagnoses:  Paronychia of middle finger     Discharge Instructions      Start antibiotic twice daily for 7 days.  May do warm Epsom salt soaks frequently to the finger.  You may take naproxen twice daily as needed for pain and inflammation.  Avoid biting your nails.  Follow-up with your PCP if your symptoms do not improve.  Please go to the ER for any worsening symptoms.  Hope you feel better soon!    ED Prescriptions     Medication Sig Dispense Auth. Provider   amoxicillin-clavulanate (AUGMENTIN) 875-125 MG tablet Take 1 tablet by mouth every 12 (twelve) hours. 14 tablet Thadeus Gandolfi, Jodi R, NP   naproxen (NAPROSYN) 375 MG tablet Take 1 tablet (375 mg total) by mouth 2 (two) times daily as needed for moderate pain (pain score 4-6). 14 tablet Sehar Sedano, Jodi R, NP      PDMP not reviewed this encounter.    [1]  Social History Tobacco Use   Smoking status: Former    Current packs/day: 0.00    Types: Cigarettes    Quit date: 05/2018    Years since quitting: 6.0   Smokeless tobacco: Never  Vaping Use   Vaping status: Former  Substance Use Topics   Alcohol use: Yes    Comment: Social.   Drug use: No     Loreda Myla SAUNDERS, NP 06/21/24 1832  "

## 2024-06-21 NOTE — ED Triage Notes (Signed)
 Pt states right middle finger pain since yesterday.  Denies any injury.  Right middle finger swollen.

## 2024-06-21 NOTE — Discharge Instructions (Addendum)
 Start antibiotic twice daily for 7 days.  May do warm Epsom salt soaks frequently to the finger.  You may take naproxen twice daily as needed for pain and inflammation.  Avoid biting your nails.  Follow-up with your PCP if your symptoms do not improve.  Please go to the ER for any worsening symptoms.  Hope you feel better soon!
